# Patient Record
Sex: Female | Born: 1990
Health system: Southern US, Community
[De-identification: ages and names within clinical notes are randomized; demographics above are authoritative.]

## PROBLEM LIST (undated history)

## (undated) ENCOUNTER — Inpatient Hospital Stay (HOSPITAL_COMMUNITY): Payer: Self-pay

## (undated) DIAGNOSIS — Z8669 Personal history of other diseases of the nervous system and sense organs: Secondary | ICD-10-CM

## (undated) DIAGNOSIS — Z789 Other specified health status: Secondary | ICD-10-CM

## (undated) HISTORY — PX: GASTRECTOMY: SHX58

## (undated) HISTORY — DX: Personal history of other diseases of the nervous system and sense organs: Z86.69

---

## 2016-12-11 ENCOUNTER — Ambulatory Visit (INDEPENDENT_AMBULATORY_CARE_PROVIDER_SITE_OTHER): Admitting: Family Medicine

## 2016-12-11 ENCOUNTER — Encounter: Payer: Self-pay | Admitting: Family Medicine

## 2016-12-11 VITALS — BP 100/62 | HR 57 | Temp 98.4°F | Ht 64.0 in | Wt 154.8 lb

## 2016-12-11 DIAGNOSIS — Z9884 Bariatric surgery status: Secondary | ICD-10-CM | POA: Insufficient documentation

## 2016-12-11 DIAGNOSIS — Z01419 Encounter for gynecological examination (general) (routine) without abnormal findings: Secondary | ICD-10-CM

## 2016-12-11 DIAGNOSIS — Z0289 Encounter for other administrative examinations: Secondary | ICD-10-CM | POA: Insufficient documentation

## 2016-12-11 NOTE — Patient Instructions (Addendum)
Nice to meet you. Good luck with your fitness test. Please continue diet and exercise. We'll get you set up with a gynecologist for Pap smear and get you set up with the bariatric surgeon.

## 2016-12-11 NOTE — Assessment & Plan Note (Signed)
Patient with history of vertical sleeve gastrectomy. Has lost over 100 pounds with this. Remains active by exercising and watching her diet. We'll refer her to a bariatric surgeon in [place] for continued follow-up on this.

## 2016-12-11 NOTE — Assessment & Plan Note (Signed)
Reports she will be due for a Pap smear later this year. Referral to gynecology placed.

## 2016-12-11 NOTE — Progress Notes (Signed)
Marikay AlarEric Ludell Zacarias, MD Phone: [phone]  Katelyn Cox is a 26 y.o. female who presents today for new patient visit.  Patient recently moved here from [place]. 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 She needs a form filled out for this stating that she is physically able to complete the test. She does exercise 5 times a week by lifting weights and running. She has no difficulty with this. She does eat a healthy diet with fruits and vegetables and proteins. She did have a vertical sleeve gastrectomy in May 2017. She was followed with [facility] bariatric [facility] [place] for this and had routine lab work ordered on this.  She reports she is due for a Pap smear this year. Had one in 2015 and reports it was normal. No prior abnormal Pap smears. Has a period once a month lasting for 5 days. She needs a referral to a gynecologist.  She reports no other medical history.  Active Ambulatory Problems    Diagnosis Date Noted  . History of bariatric surgery 12/11/2016  . Visit for gynecologic examination 12/11/2016   Resolved Ambulatory Problems    Diagnosis Date Noted  . No Resolved Ambulatory Problems   No Additional Past Medical History    Family History  Problem Relation Age of Onset  . Leukemia Mother   . Throat cancer Maternal Grandmother     Social History   Social History  . Marital status: Single    Spouse name: N/A  . Number of children: N/A  . Years of education: N/A   Occupational History  . Not on file.   Social History Main Topics  . Smoking status: Never Smoker  . Smokeless tobacco: Never Used  . Alcohol use 0.6 oz/week    1 Glasses of wine per week  . Drug use: No  . Sexual activity: Not on file   Other Topics Concern  . Not on file   Social History Narrative  . No narrative on file    ROS  General:  Negative for nexplained weight loss, fever Skin: Negative for new or changing  mole, sore that won't heal HEENT: Negative for trouble hearing, trouble seeing, ringing in ears, mouth sores, hoarseness, change in voice, dysphagia. CV:  Negative for chest pain, dyspnea, edema, palpitations Resp: Negative for cough, dyspnea, hemoptysis GI: Negative for nausea, vomiting, diarrhea, constipation, abdominal pain, melena, hematochezia. GU: Negative for dysuria, incontinence, urinary hesitance, hematuria, vaginal or penile discharge, polyuria, sexual difficulty, lumps in testicle or breasts MSK: Negative for muscle cramps or aches, joint pain or swelling Neuro: Negative for headaches, weakness, numbness, dizziness, passing out/fainting Psych: Negative for depression, anxiety, memory problems  Objective  Physical Exam Vitals:   12/11/16 1045  BP: 100/62  Pulse: (!) 57  Temp: 98.4 F (36.9 C)    BP Readings from Last 3 Encounters:  12/11/16 100/62   Wt Readings from Last 3 Encounters:  12/11/16 154 lb 12.8 oz (70.2 kg)    Physical Exam  Constitutional: She is well-developed, well-nourished, and in no distress.  HENT:  Head: Normocephalic and atraumatic.  Mouth/Throat: Oropharynx is clear and moist. No oropharyngeal exudate.  Eyes: Conjunctivae are normal. Pupils are equal, round, and reactive to light.  Neck: Neck supple.  Cardiovascular: Normal rate, regular rhythm and normal heart sounds.   Pulmonary/Chest: Effort normal and breath sounds normal.  Abdominal: Soft. Bowel sounds are normal.  Musculoskeletal: She  exhibits no edema.  Lymphadenopathy:    She has no cervical adenopathy.  Neurological: She is alert. Gait normal.  Skin: Skin is warm and dry.  Psychiatric: Mood and affect normal.     Assessment/Plan:   History of bariatric surgery Patient with history of vertical sleeve gastrectomy. Has lost over 100 pounds with this. Remains active by exercising and watching her diet. We'll refer her to a bariatric surgeon in [place] for continued follow-up  on this.  Visit for gynecologic examination Reports she will be due for a Pap smear later this year. Referral to gynecology placed.  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 She has no issues exercising.  We will request records from her prior physicians in [place].  Orders Placed This Encounter  Procedures  . Ambulatory referral to Gynecology    Referral Priority:   Routine    Referral Type:   Consultation    Referral Reason:   Specialty Services Required    Requested Specialty:   Gynecology    Number of Visits Requested:   1  . Ambulatory referral to General Surgery    Referral Priority:   Routine    Referral Type:   Surgical    Referral Reason:   Specialty Services Required    Requested Specialty:   General Surgery    Number of Visits Requested:   1    No orders of the defined types were placed in this encounter.    Marikay Alar, MD [facility]

## 2016-12-11 NOTE — Progress Notes (Signed)
Pre visit review using our clinic review tool, if applicable. No additional management support is needed unless otherwise documented below in the visit note. 

## 2017-01-23 ENCOUNTER — Encounter: Payer: Self-pay | Admitting: Certified Nurse Midwife

## 2017-02-20 ENCOUNTER — Other Ambulatory Visit: Payer: Self-pay | Admitting: Occupational Medicine

## 2017-02-20 ENCOUNTER — Ambulatory Visit
Admission: RE | Admit: 2017-02-20 | Discharge: 2017-02-20 | Disposition: A | Discharge status: Home / Self Care | Source: Ambulatory Visit | Attending: Occupational Medicine | Admitting: Occupational Medicine

## 2017-02-20 DIAGNOSIS — Z021 Encounter for pre-employment examination: Secondary | ICD-10-CM

## 2017-10-28 ENCOUNTER — Other Ambulatory Visit: Payer: Self-pay

## 2017-10-28 ENCOUNTER — Encounter (HOSPITAL_COMMUNITY): Payer: Self-pay | Admitting: Emergency Medicine

## 2017-10-28 ENCOUNTER — Emergency Department (HOSPITAL_COMMUNITY): Payer: 59

## 2017-10-28 ENCOUNTER — Emergency Department (HOSPITAL_COMMUNITY)
Admission: EM | Admit: 2017-10-28 | Discharge: 2017-10-28 | Disposition: A | Discharge status: Home / Self Care | Payer: 59 | Attending: Emergency Medicine | Admitting: Emergency Medicine

## 2017-10-28 DIAGNOSIS — S199XXA Unspecified injury of neck, initial encounter: Secondary | ICD-10-CM | POA: Diagnosis not present

## 2017-10-28 DIAGNOSIS — S060X1A Concussion with loss of consciousness of 30 minutes or less, initial encounter: Secondary | ICD-10-CM | POA: Insufficient documentation

## 2017-10-28 DIAGNOSIS — Y9301 Activity, walking, marching and hiking: Secondary | ICD-10-CM | POA: Diagnosis not present

## 2017-10-28 DIAGNOSIS — W19XXXA Unspecified fall, initial encounter: Secondary | ICD-10-CM

## 2017-10-28 DIAGNOSIS — W0110XA Fall on same level from slipping, tripping and stumbling with subsequent striking against unspecified object, initial encounter: Secondary | ICD-10-CM | POA: Diagnosis not present

## 2017-10-28 DIAGNOSIS — Y9289 Other specified places as the place of occurrence of the external cause: Secondary | ICD-10-CM | POA: Diagnosis not present

## 2017-10-28 DIAGNOSIS — R51 Headache: Secondary | ICD-10-CM | POA: Diagnosis not present

## 2017-10-28 DIAGNOSIS — Y998 Other external cause status: Secondary | ICD-10-CM | POA: Insufficient documentation

## 2017-10-28 DIAGNOSIS — S0990XA Unspecified injury of head, initial encounter: Secondary | ICD-10-CM | POA: Diagnosis not present

## 2017-10-28 DIAGNOSIS — M542 Cervicalgia: Secondary | ICD-10-CM | POA: Diagnosis not present

## 2017-10-28 MED ORDER — ONDANSETRON 4 MG PO TBDP: 4.0000 mg | ORAL_TABLET | Freq: Three times a day (TID) | ORAL | 0 refills | Status: DC | PRN

## 2017-10-28 MED ORDER — ONDANSETRON 4 MG PO TBDP
4.0000 mg | ORAL_TABLET | Freq: Once | ORAL | Status: AC
  Administered 2017-10-28: 4 mg via ORAL
  Filled 2017-10-28: qty 1

## 2017-10-28 MED ORDER — TRAMADOL HCL 50 MG PO TABS: 50.0000 mg | ORAL_TABLET | Freq: Four times a day (QID) | ORAL | 0 refills | Status: DC | PRN

## 2017-10-28 MED ORDER — TRAMADOL HCL 50 MG PO TABS
50.0000 mg | ORAL_TABLET | Freq: Once | ORAL | Status: AC
  Administered 2017-10-28: 50 mg via ORAL
  Filled 2017-10-28: qty 1

## 2017-10-28 NOTE — ED Triage Notes (Signed)
Patient slipped and fell backward on pavement, hitting back of head and neck on pavement, positive momentary LOC. Complaining of pain to posterior head and neck. Patient alert and oriented and in no apparent distress at this time.

## 2017-10-28 NOTE — ED Provider Notes (Signed)
[facility] Provider Note   CSN: [id number] Arrival date & time: 10/28/17  [id number]     History   Chief Complaint Chief Complaint  Patient presents with  . Fall    HPI Katelyn Cox is a 26 y.o. female.  HPI   26 year old female with history of bariatric surgery and pseudotumor, now resolved, who presents with fall.  The patient is currently in the police academy.  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  There was a positive loss of consciousness for at least several minutes.  No vomiting.  She has been nauseous since the event, but has been able to eat and drink and walk.  She denies any associated upper or lower extremity numbness or tingling.  She was well prior to the episode.  No history of concussions.  No blood thinner use.  No history of frequent falls or easy bruising or bleeding.  She, throbbing posterior headache right between her spine and skull.  Pain is worse with any movement or palpation.  History reviewed. No pertinent past medical history.  Patient Active Problem List   Diagnosis Date Noted  . History of bariatric surgery 12/11/2016  . Visit for gynecologic examination 12/11/2016    Past Surgical History:  Procedure Laterality Date  . GASTRECTOMY     vertical sleeve gastrectomy    OB History    No data available       Home Medications    Prior to Admission medications   Medication Sig Start Date End Date Taking? Authorizing Provider  ondansetron (ZOFRAN ODT) 4 MG disintegrating tablet Take 1 tablet (4 mg total) by mouth every 8 (eight) hours as needed for nausea or vomiting. 10/28/17   Shaune PollackIsaacs, De Lib[place], MD  traMADol (ULTRAM) 50 MG tablet Take 1 tablet (50 mg total) by mouth every 6 (six) hours as needed for moderate pain. 10/28/17   Shaune PollackIsaacs, Dmari Schubring, MD    Family History Family History  Problem Relation Age of Onset  . Leukemia Mother   . Throat cancer Maternal  Grandmother     Social History Social History   Tobacco Use  . Smoking status: Never Smoker  . Smokeless tobacco: Never Used  Substance Use Topics  . Alcohol use: Yes    Alcohol/week: 0.6 oz    Types: 1 Glasses of wine per week  . Drug use: No     Allergies   Patient has no known allergies.   Review of Systems Review of Systems  Constitutional: Negative for chills, fatigue and fever.  HENT: Negative for congestion and rhinorrhea.   Eyes: Negative for visual disturbance.  Respiratory: Negative for cough, shortness of breath and wheezing.   Cardiovascular: Negative for chest pain and leg swelling.  Gastrointestinal: Negative for abdominal pain, diarrhea, nausea and vomiting.  Genitourinary: Negative for dysuria and flank pain.  Musculoskeletal: Positive for neck pain. Negative for neck stiffness.  Skin: Negative for rash and wound.  Allergic/Immunologic: Negative for immunocompromised state.  Neurological: Positive for syncope and headaches. Negative for weakness.  All other systems reviewed and are negative.    Physical Exam Updated Vital Signs BP 129/89 (BP Location: Right Arm)   Pulse 77   Temp 98.6 F (37 C) (Oral)   Resp 15   LMP 10/24/2017 (Exact Date)   SpO2 99%   Physical Exam  Constitutional: She is oriented to person, place, and time. She appears well-developed and well-nourished. No distress.  HENT:  Head: Normocephalic and atraumatic.  No open wounds or lacerations.  Mild tenderness to palpation over occiput without appreciable hematoma.  No postauricular bleeding.  No hemotympanum.  No periocular ecchymoses.  Eyes: Conjunctivae are normal.  Neck: Neck supple.  Mild tenderness to palpation over upper cervical, midline spine.  No bruising or deformity.  No paraspinal tenderness.  Cardiovascular: Normal rate, regular rhythm and normal heart sounds. Exam reveals no friction rub.  No murmur heard. Pulmonary/Chest: Effort normal and breath sounds normal.  No respiratory distress. She has no wheezes. She has no rales.  Abdominal: She exhibits no distension.  Musculoskeletal: She exhibits no edema.  Neurological: She is alert and oriented to person, place, and time. She exhibits normal muscle tone.  Skin: Skin is warm. Capillary refill takes less than 2 seconds.  Psychiatric: She has a normal mood and affect.  Nursing note and vitals reviewed.    ED Treatments / Results  Labs (all labs ordered are listed, but only abnormal results are displayed) Labs Reviewed - No data to display  EKG  EKG Interpretation  Date/Time:  Tuesday October 28 2017 07:28:47 EST Ventricular Rate:  79 PR Interval:    QRS Duration: 89 QT Interval:  389 QTC Calculation: 446 R Axis:   91 Text Interpretation:  Sinus rhythm Borderline right axis deviation No old tracing to compare EKG WITHIN NORMAL LIMITS Confirmed by Shaune PollackIsaacs, Keyondre Hepburn [id number]) on 10/28/2017 7:39:59 AM       Radiology Ct Head Wo Contrast  Result Date: 10/28/2017 CLINICAL DATA:  Pain following fall EXAM: CT HEAD WITHOUT CONTRAST CT CERVICAL SPINE WITHOUT CONTRAST TECHNIQUE: Multidetector CT imaging of the head and cervical spine was performed following the standard protocol without intravenous contrast. Multiplanar CT image reconstructions of the cervical spine were also generated. COMPARISON:  None. FINDINGS: CT HEAD FINDINGS Brain: The ventricles are normal in size and configuration. There is no intracranial mass, hemorrhage, extra-axial fluid collection, or midline shift. Gray-white compartments are normal. There is no evident acute infarct. Vascular: No hyperdense vessel.  No vascular calcification evident. Skull: The bony calvarium appears intact. Sinuses/Orbits: Visualized paranasal sinuses are clear. Visualized orbits appear symmetric bilaterally. Other: Mastoid air cells are clear. CT CERVICAL SPINE FINDINGS Alignment: There is no spondylolisthesis. Skull base and vertebrae: Skull base and  craniocervical junction regions appear normal. There is no evident fracture. There are no blastic or lytic bone lesions. Soft tissues and spinal canal: Prevertebral soft tissues and predental space regions are normal. There are no paraspinous lesions. There is no cord or canal hematoma evident. Disc levels: Disc spaces appear normal. There is no appreciable nerve root edema or effacement. No disc extrusion or stenosis. Upper chest: Visualized upper lung zones are clear. Other: None IMPRESSION: CT head:  Study within normal limits. CT cervical spine: No fracture or spondylolisthesis. No appreciable arthropathy. Electronically Signed   By: Bretta BangWilliam  Woodruff III M.D.   On: 10/28/2017 08:22   Ct Cervical Spine Wo Contrast  Result Date: 10/28/2017 CLINICAL DATA:  Pain following fall EXAM: CT HEAD WITHOUT CONTRAST CT CERVICAL SPINE WITHOUT CONTRAST TECHNIQUE: Multidetector CT imaging of the head and cervical spine was performed following the standard protocol without intravenous contrast. Multiplanar CT image reconstructions of the cervical spine were also generated. COMPARISON:  None. FINDINGS: CT HEAD FINDINGS Brain: The ventricles are normal in size and configuration. There is no intracranial mass, hemorrhage, extra-axial fluid collection, or midline shift. Gray-white compartments are normal. There is no evident acute infarct. Vascular: No hyperdense vessel.  No vascular calcification evident. Skull: The bony calvarium appears intact. Sinuses/Orbits: Visualized paranasal sinuses are clear. Visualized orbits appear symmetric bilaterally. Other: Mastoid air cells are clear. CT CERVICAL SPINE FINDINGS Alignment: There is no spondylolisthesis. Skull base and vertebrae: Skull base and craniocervical junction regions appear normal. There is no evident fracture. There are no blastic or lytic bone lesions. Soft tissues and spinal canal: Prevertebral soft tissues and predental space regions are normal. There are no  paraspinous lesions. There is no cord or canal hematoma evident. Disc levels: Disc spaces appear normal. There is no appreciable nerve root edema or effacement. No disc extrusion or stenosis. Upper chest: Visualized upper lung zones are clear. Other: None IMPRESSION: CT head:  Study within normal limits. CT cervical spine: No fracture or spondylolisthesis. No appreciable arthropathy. Electronically Signed   By: Bretta Bang III M.D.   On: 10/28/2017 08:22    Procedures Procedures (including critical care time)  Medications Ordered in ED Medications  ondansetron (ZOFRAN-ODT) disintegrating tablet 4 mg (4 mg Oral Given 10/28/17 0739)  traMADol (ULTRAM) tablet 50 mg (50 mg Oral Given 10/28/17 0739)     Initial Impression / Assessment and Plan / ED Course  I have reviewed the triage vital signs and the nursing notes.  Pertinent labs & imaging results that were available during my care of the patient were reviewed by me and considered in my medical decision making (see chart for details).     26 year old female with no significant past medical history, not on blood thinners, here with fall and loss of consciousness.  The fall was mechanical in nature.  EKG is unremarkable with normal intervals.  Will obtain CT scans.  I suspect patient at least has a minor concussion.  No clinical signs of basilar skull fracture.  No upper extremity or lower extremity numbness, tingling, weakness, or signs to suggest occult spinal cord injury/central cord syndrome.  CT imaging is negative.  Patient feels better after symptomatic treatment.  Will treat his mild concussion with strict return to work/play instructions, and discharged home.  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  Final Clinical Impressions(s) / ED Diagnoses   Final diagnoses:  Fall, initial encounter  Concussion with loss of consciousness of 30 minutes or less, initial encounter    ED Discharge Orders        Ordered     ondansetron (ZOFRAN ODT) 4 MG disintegrating tablet  Every 8 hours PRN     10/28/17 0848    traMADol (ULTRAM) 50 MG tablet  Every 6 hours PRN     10/28/17 0848       Shaune Pollack, MD 10/28/17 [phone]

## 2017-10-28 NOTE — Discharge Instructions (Signed)

## 2017-11-18 NOTE — L&D Delivery Note (Signed)
Delivery Note At 2:32 PM a viable female was delivered via  (Presentation:DOA ;  ).  APGAR: per nursing notes , ; weight pending .   Placenta status: , spontaneous, intact.  Cord: 3VC, loose nuchal, rotated and delivered through with the following complications: .none  Cord pH: n/a  Anesthesia:  epidural Episiotomy:  none Lacerations:  none Suture Repair: nA Est. Blood Loss (mL):  Per nursing notes  Mom to postpartum.  Baby to Couplet care / Skin to Skin.  Katelyn ColonelKelly A Melanye Cox 11/17/2018, 2:46 PM

## 2017-11-26 IMAGING — CR DG CHEST 1V
1 series · 1 of 1 positions shown · non-contrast
Comparison: No prior.

CLINICAL DATA: Pre-employment exam.

EXAM:
CHEST 1 VIEW

[w chest pa]
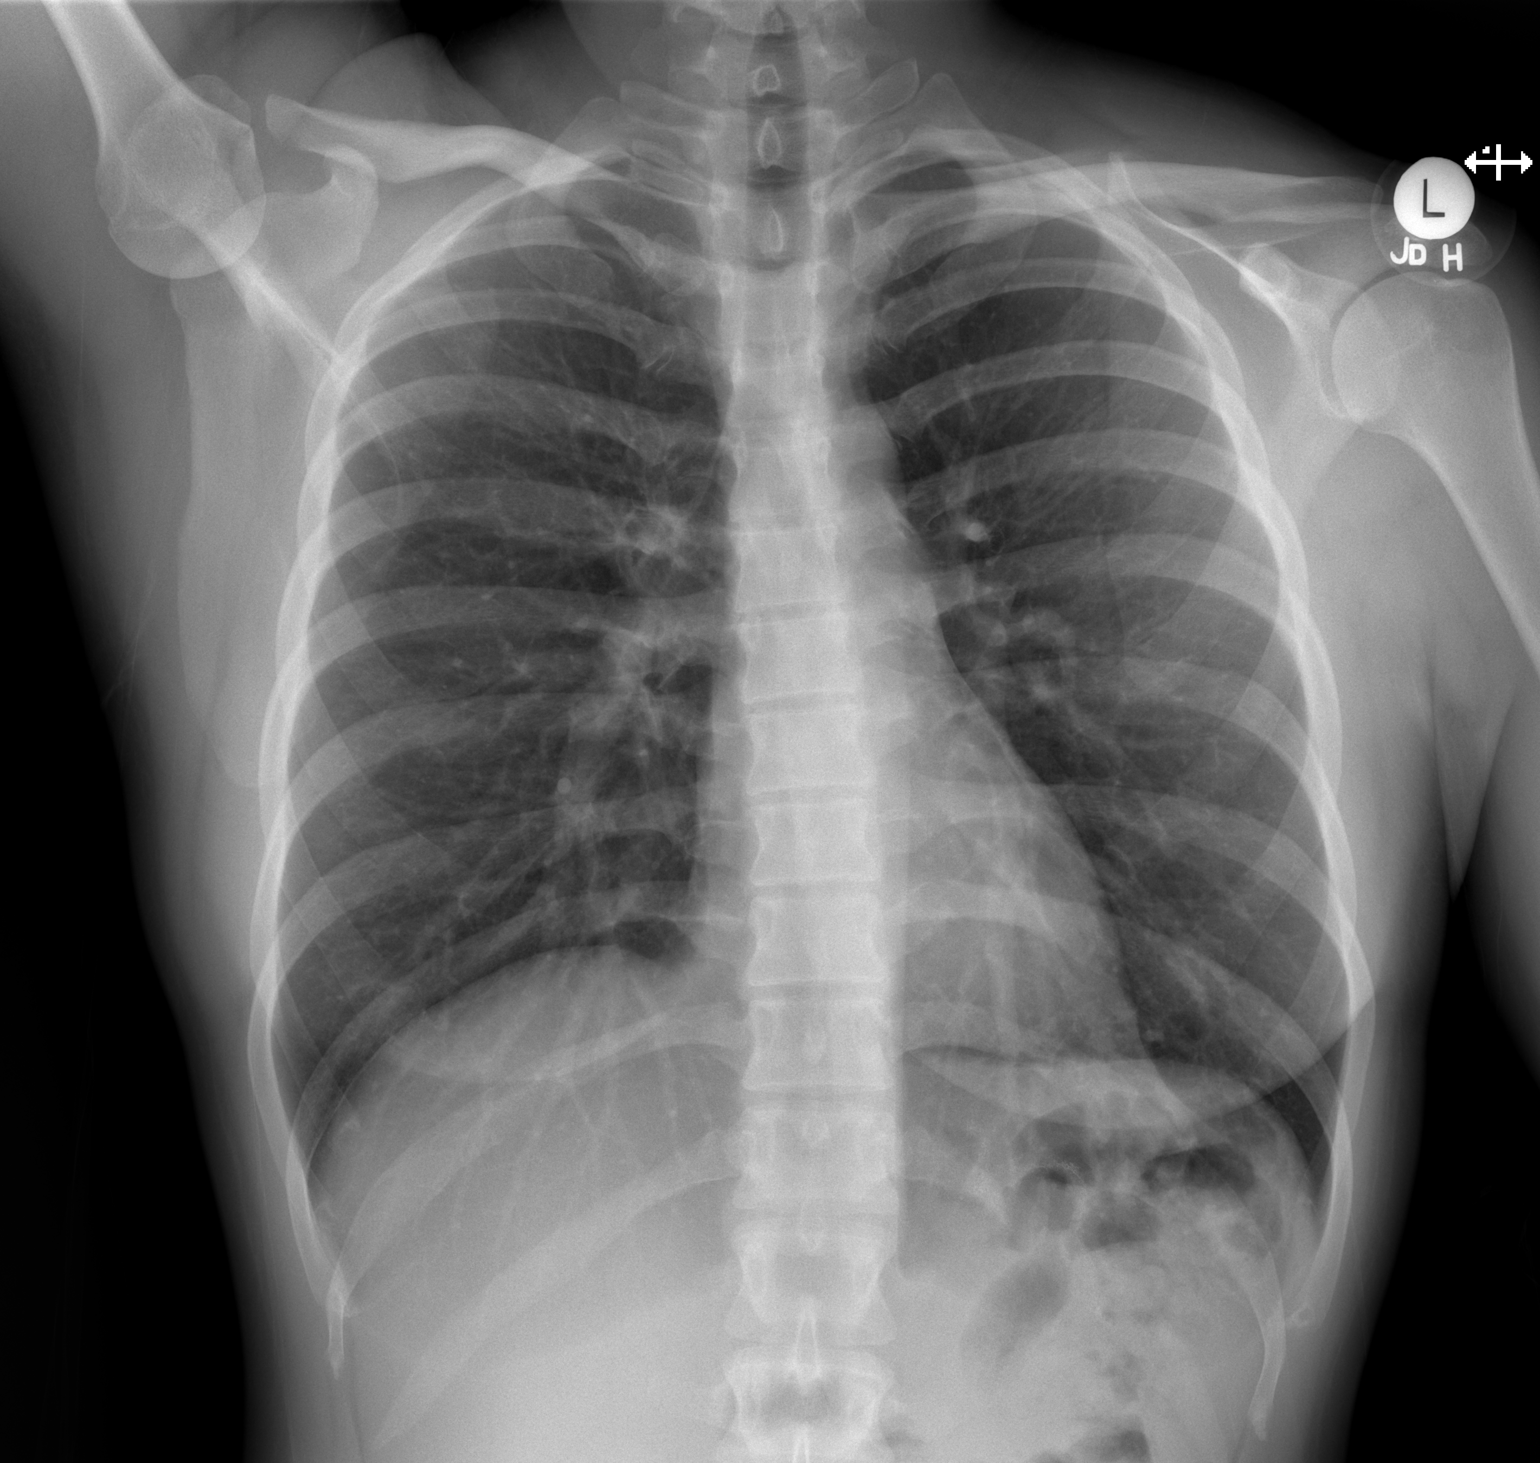

[1 of 1 positions shown; findings below may reference images not displayed]

FINDINGS: Mediastinum and hilar structures normal. Lungs are clear. No pleural
effusion or pneumothorax. Mild scoliosis thoracic spine.
IMPRESSION: No acute abnormality.

## 2018-02-02 DIAGNOSIS — M5412 Radiculopathy, cervical region: Secondary | ICD-10-CM | POA: Diagnosis not present

## 2018-02-02 DIAGNOSIS — M9902 Segmental and somatic dysfunction of thoracic region: Secondary | ICD-10-CM | POA: Diagnosis not present

## 2018-02-02 DIAGNOSIS — M9901 Segmental and somatic dysfunction of cervical region: Secondary | ICD-10-CM | POA: Diagnosis not present

## 2018-02-03 DIAGNOSIS — M9902 Segmental and somatic dysfunction of thoracic region: Secondary | ICD-10-CM | POA: Diagnosis not present

## 2018-02-03 DIAGNOSIS — M9901 Segmental and somatic dysfunction of cervical region: Secondary | ICD-10-CM | POA: Diagnosis not present

## 2018-02-03 DIAGNOSIS — M5412 Radiculopathy, cervical region: Secondary | ICD-10-CM | POA: Diagnosis not present

## 2018-02-04 DIAGNOSIS — M5412 Radiculopathy, cervical region: Secondary | ICD-10-CM | POA: Diagnosis not present

## 2018-02-04 DIAGNOSIS — M9902 Segmental and somatic dysfunction of thoracic region: Secondary | ICD-10-CM | POA: Diagnosis not present

## 2018-02-04 DIAGNOSIS — M9901 Segmental and somatic dysfunction of cervical region: Secondary | ICD-10-CM | POA: Diagnosis not present

## 2018-03-10 DIAGNOSIS — N926 Irregular menstruation, unspecified: Secondary | ICD-10-CM | POA: Diagnosis not present

## 2018-03-16 DIAGNOSIS — Z3201 Encounter for pregnancy test, result positive: Secondary | ICD-10-CM | POA: Diagnosis not present

## 2018-04-01 DIAGNOSIS — Z3201 Encounter for pregnancy test, result positive: Secondary | ICD-10-CM | POA: Diagnosis not present

## 2018-04-22 DIAGNOSIS — Z3481 Encounter for supervision of other normal pregnancy, first trimester: Secondary | ICD-10-CM | POA: Diagnosis not present

## 2018-04-22 LAB — OB RESULTS CONSOLE ABO/RH: RH Type: POSITIVE

## 2018-04-22 LAB — OB RESULTS CONSOLE ANTIBODY SCREEN: ANTIBODY SCREEN: NEGATIVE

## 2018-04-22 LAB — OB RESULTS CONSOLE GC/CHLAMYDIA
Chlamydia: NEGATIVE
GC PROBE AMP, GENITAL: NEGATIVE

## 2018-04-22 LAB — OB RESULTS CONSOLE HIV ANTIBODY (ROUTINE TESTING): HIV: NONREACTIVE

## 2018-04-22 LAB — OB RESULTS CONSOLE RUBELLA ANTIBODY, IGM: Rubella: IMMUNE

## 2018-04-22 LAB — OB RESULTS CONSOLE RPR: RPR: NONREACTIVE

## 2018-04-22 LAB — OB RESULTS CONSOLE HEPATITIS B SURFACE ANTIGEN: Hepatitis B Surface Ag: NEGATIVE

## 2018-04-29 DIAGNOSIS — Z118 Encounter for screening for other infectious and parasitic diseases: Secondary | ICD-10-CM | POA: Diagnosis not present

## 2018-04-29 DIAGNOSIS — Z3481 Encounter for supervision of other normal pregnancy, first trimester: Secondary | ICD-10-CM | POA: Diagnosis not present

## 2018-07-03 ENCOUNTER — Other Ambulatory Visit: Payer: Self-pay | Admitting: Obstetrics

## 2018-07-14 ENCOUNTER — Ambulatory Visit (HOSPITAL_COMMUNITY)
Admission: RE | Admit: 2018-07-14 | Discharge: 2018-07-14 | Disposition: A | Discharge status: Home / Self Care | Payer: 59 | Source: Ambulatory Visit | Attending: Obstetrics | Admitting: Obstetrics

## 2018-07-14 DIAGNOSIS — D509 Iron deficiency anemia, unspecified: Secondary | ICD-10-CM | POA: Diagnosis present

## 2018-07-14 MED ORDER — SODIUM CHLORIDE 0.9 % IV SOLN
INTRAVENOUS | Status: DC | PRN
  Administered 2018-07-14: 250 mL via INTRAVENOUS

## 2018-07-14 MED ORDER — SODIUM CHLORIDE 0.9 % IV SOLN
510.0000 mg | Freq: Once | INTRAVENOUS | Status: AC
  Administered 2018-07-14: 510 mg via INTRAVENOUS
  Filled 2018-07-14: qty 17

## 2018-07-14 NOTE — Progress Notes (Signed)
PATIENT CARE CENTER NOTE  Diagnosis: Iron Deficiency Anemia    Provider: Dr. Ernestina PennaFogleman    Procedure: IV Feraheme    Note: Patient received IV Feraheme infusion. Patient tolerated infusion well with no adverse reaction. Vital signs remained stable. Discharge instructions given. Patient alert, oriented and ambulatory at discharge.

## 2018-07-14 NOTE — Discharge Instructions (Signed)

## 2018-07-21 ENCOUNTER — Ambulatory Visit (HOSPITAL_COMMUNITY)
Admission: RE | Admit: 2018-07-21 | Discharge: 2018-07-21 | Disposition: A | Discharge status: Home / Self Care | Payer: 59 | Source: Ambulatory Visit | Attending: Obstetrics | Admitting: Obstetrics

## 2018-07-21 DIAGNOSIS — D509 Iron deficiency anemia, unspecified: Secondary | ICD-10-CM | POA: Insufficient documentation

## 2018-07-21 MED ORDER — SODIUM CHLORIDE 0.9 % IV SOLN
INTRAVENOUS | Status: DC
  Administered 2018-07-21: 11:00:00 via INTRAVENOUS

## 2018-07-21 MED ORDER — SODIUM CHLORIDE 0.9 % IV SOLN
510.0000 mg | Freq: Once | INTRAVENOUS | Status: AC
  Administered 2018-07-21: 510 mg via INTRAVENOUS
  Filled 2018-07-21: qty 17

## 2018-07-21 NOTE — Discharge Instructions (Signed)

## 2018-07-21 NOTE — Progress Notes (Addendum)
PATIENT CARE CENTER NOTE  Diagnosis: Iron Deficiency Anemia    Provider: Dr. Ernestina Penna    Procedure: IV Feraheme    Note: Patient received IV Feraheme infusion. Patient tolerated infusion well with no adverse reaction. Vital signs remained stable. Discharge instructions given. Patient alert, oriented and ambulatory at discharge.

## 2018-08-26 DIAGNOSIS — Z23 Encounter for immunization: Secondary | ICD-10-CM | POA: Diagnosis not present

## 2018-08-27 DIAGNOSIS — O3663X0 Maternal care for excessive fetal growth, third trimester, not applicable or unspecified: Secondary | ICD-10-CM | POA: Diagnosis not present

## 2018-08-27 DIAGNOSIS — Z3A27 27 weeks gestation of pregnancy: Secondary | ICD-10-CM | POA: Diagnosis not present

## 2018-08-27 DIAGNOSIS — O99012 Anemia complicating pregnancy, second trimester: Secondary | ICD-10-CM | POA: Diagnosis not present

## 2018-09-08 ENCOUNTER — Other Ambulatory Visit: Payer: Self-pay

## 2018-09-08 ENCOUNTER — Observation Stay
Admission: EM | Admit: 2018-09-08 | Discharge: 2018-09-09 | Disposition: A | Discharge status: Home / Self Care | Payer: 59 | Attending: Obstetrics and Gynecology | Admitting: Obstetrics and Gynecology

## 2018-09-08 DIAGNOSIS — O26893 Other specified pregnancy related conditions, third trimester: Principal | ICD-10-CM | POA: Insufficient documentation

## 2018-09-08 DIAGNOSIS — Z3A30 30 weeks gestation of pregnancy: Secondary | ICD-10-CM

## 2018-09-08 NOTE — OB Triage Note (Signed)
Pt arrived to unit with c/o lower abdominal contractions q 2 min, lasting 40-60 seconds x 2 hours. Denies LOF, bleeding or other concerns. +FM. Continue to assess.

## 2018-09-09 DIAGNOSIS — Z3A29 29 weeks gestation of pregnancy: Secondary | ICD-10-CM | POA: Diagnosis not present

## 2018-09-09 DIAGNOSIS — Z043 Encounter for examination and observation following other accident: Secondary | ICD-10-CM | POA: Diagnosis not present

## 2018-09-09 DIAGNOSIS — O26893 Other specified pregnancy related conditions, third trimester: Secondary | ICD-10-CM | POA: Diagnosis not present

## 2018-09-09 DIAGNOSIS — Z3A30 30 weeks gestation of pregnancy: Secondary | ICD-10-CM | POA: Diagnosis not present

## 2018-09-09 DIAGNOSIS — O479 False labor, unspecified: Secondary | ICD-10-CM | POA: Diagnosis not present

## 2018-09-09 NOTE — Discharge Summary (Signed)
Physician Discharge Summary   Patient ID: Katelyn Cox [id number] 27 y.o. 1991/01/22  Admit date: 09/08/2018  Discharge date and time: No discharge date for patient encounter.   Admitting Physician: Natale Milch, MD   Discharge Physician: Adelene Idler MD  Admission Diagnoses: [redacted] week gestation, abdominal/ back pain Discharge Diagnoses: same as above  Admission Condition: good  Discharged Condition: good  Indication for Admission: Triage visit  Hospital Course: She was admitted and evaluated in triage. She reports that this evening since about 7:30 pm she began experiencing cramping abdominal pain. She follows with an OB/GYN in [place], but lives closer to here and wished to be evaluated. She had a similar episode earlier in the pregnancy and it passed on its own. She requested discharge through nursing earlier this evening.  She denies leakage of fluid. Denies vaginal bleeding. She is feeling fetal movement. She has had an uncomplicated regnancy. She denies medical problems. She denies current medications. She has had a gastric sleeve surgery. She reports that her last intercourse was on Monday.  SVE showed her to be 1 cm/ long/ -3 station  Consults: None  Significant Diagnostic Studies: None  Treatments: None  Discharge Exam: Temp 98 F (36.7 C) (Oral)   Resp 18   Ht 5\' 4"  (1.626 m)   Wt 85.7 kg   LMP 10/24/2017 (Exact Date)   BMI 32.44 kg/m   General Appearance:    Alert, cooperative, no distress, appears stated age  Head:    Normocephalic, without obvious abnormality, atraumatic  Eyes:    PERRL, conjunctiva/corneas clear, EOM's intact, fundi    benign, both eyes  Ears:    Normal TM's and external ear canals, both ears  Nose:   Nares normal, septum midline, mucosa normal, no drainage    or sinus tenderness  Throat:   Lips, mucosa, and tongue normal; teeth and gums normal  Neck:   Supple, symmetrical, trachea midline, no adenopathy;    thyroid:  no  enlargement/tenderness/nodules; no carotid   bruit or JVD  Back:     Symmetric, no curvature, ROM normal, no CVA tenderness  Lungs:     Clear to auscultation bilaterally, respirations unlabored  Chest Wall:    No tenderness or deformity   Heart:    Regular rate and rhythm, S1 and S2 normal, no murmur, rub   or gallop  Breast Exam:    No tenderness, masses, or nipple abnormality  Abdomen:     Soft, non-tender, bowel sounds active all four quadrants,    no masses, no organomegaly  Genitalia:    Normal female without lesion, discharge or tenderness. SVE 1/long/-3  Rectal:    Normal tone, normal prostate, no masses or tenderness;   guaiac negative stool  Extremities:   Extremities normal, atraumatic, no cyanosis or edema  Pulses:   2+ and symmetric all extremities  Skin:   Skin color, texture, turgor normal, no rashes or lesions  Lymph nodes:   Cervical, supraclavicular, and axillary nodes normal  Neurologic:   CNII-XII intact, normal strength, sensation and reflexes    throughout    Disposition:   Patient Instructions:   Activity: activity as tolerated Diet: regular diet Wound Care: none needed  Follow-up with her OBGYN in 1 days if her cramping returns or worsens.   Signed: Natale Milch 09/09/2018 1:45 AM

## 2018-09-09 NOTE — Discharge Instructions (Signed)
Preterm Labor and Birth Information Pregnancy normally lasts 39-41 weeks. Preterm labor is when labor starts early. It starts before you have been pregnant for 37 whole weeks. What are the risk factors for preterm labor? Preterm labor is more likely to occur in women who:  Have an infection while pregnant.  Have a cervix that is short.  Have gone into preterm labor before.  Have had surgery on their cervix.  Are younger than age 28.  Are older than age 67.  Are African American.  Are pregnant with two or more babies.  Take street drugs while pregnant.  Smoke while pregnant.  Do not gain enough weight while pregnant.  Got pregnant right after another pregnancy.  What are the symptoms of preterm labor? Symptoms of preterm labor include:  Cramps. The cramps may feel like the cramps some women get during their period. The cramps may happen with watery poop (diarrhea).  Pain in the belly (abdomen).  Pain in the lower back.  Regular contractions or tightening. It may feel like your belly is getting tighter.  Pressure in the lower belly that seems to get stronger.  More fluid (discharge) leaking from the vagina. The fluid may be watery or bloody.  Water breaking.  Why is it important to notice signs of preterm labor? Babies who are born early may not be fully developed. They have a higher chance for:  Long-term heart problems.  Long-term lung problems.  Trouble controlling body systems, like breathing.  Bleeding in the brain.  A condition called cerebral palsy.  Learning difficulties.  Death.  These risks are highest for babies who are born before 34 weeks of pregnancy. How is preterm labor treated? Treatment depends on:  How long you were pregnant.  Your condition.  The health of your baby.  Treatment may involve:  Having a stitch (suture) placed in your cervix. When you give birth, your cervix opens so the baby can come out. The stitch keeps the  cervix from opening too soon.  Staying at the hospital.  Taking or getting medicines, such as: ? Hormone medicines. ? Medicines to stop contractions. ? Medicines to help the babys lungs develop. ? Medicines to prevent your baby from having cerebral palsy.  What should I do if I am in preterm labor? If you think you are going into labor too soon, call your doctor right away. How can I prevent preterm labor?  Do not use any tobacco products. ? Examples of these are cigarettes, chewing tobacco, and e-cigarettes. ? If you need help quitting, ask your doctor.  Do not use street drugs.  Do not use any medicines unless you ask your doctor if they are safe for you.  Talk with your doctor before taking any herbal supplements.  Make sure you gain enough weight.  Watch for infection. If you think you might have an infection, get it checked right away.  If you have gone into preterm labor before, tell your doctor. This information is not intended to replace advice given to you by your health care provider. Make sure you discuss any questions you have with your health care provider. Document Released: 01/31/2009 Document Revised: 04/16/2016 Document Reviewed: 03/27/2016 Elsevier Interactive Patient Education  2018 [employer]. LABOR: When contractions begin, you should start to time them from the beginning of one contraction to the beginning of the next.  When contractions are 5-10 minutes apart or less and have been regular for at least an hour, you should call your  health care provider.  Notify your doctor if any of the following occur: 1. Bleeding from the vagina 7. Sudden, constant, or occasional abdominal pain  2. Pain or burning when urinating 8. Sudden gushing of fluid from the vagina (with or without continued leaking)  3. Chills or fever 9. Fainting spells, "black outs" or loss of consciousness  4. Increase in vaginal discharge 10. Severe or continued nausea or vomiting  5.  Pelvic pressure (sudden increase) 11. Blurring of vision or spots before the eyes  6. Baby moving less than usual 12. Leaking of fluid    FETAL KICK COUNT: Lie on your left side for one hour after a meal, and count the number of times your baby kicks. If it is less than 5 times, get up, move around and drink some juice. Repeat the test 30 minutes later. If it is still less than 5 kicks in an hour, notify your doctor.

## 2018-09-16 DIAGNOSIS — Z3A30 30 weeks gestation of pregnancy: Secondary | ICD-10-CM | POA: Diagnosis not present

## 2018-09-16 DIAGNOSIS — Z23 Encounter for immunization: Secondary | ICD-10-CM | POA: Diagnosis not present

## 2018-09-16 DIAGNOSIS — O3663X0 Maternal care for excessive fetal growth, third trimester, not applicable or unspecified: Secondary | ICD-10-CM | POA: Diagnosis not present

## 2018-10-08 ENCOUNTER — Inpatient Hospital Stay (HOSPITAL_COMMUNITY)
Admission: AD | Admit: 2018-10-08 | Discharge: 2018-10-08 | Disposition: A | Discharge status: Home / Self Care | Payer: 59 | Source: Ambulatory Visit | Attending: Obstetrics & Gynecology | Admitting: Obstetrics & Gynecology

## 2018-10-08 ENCOUNTER — Other Ambulatory Visit: Payer: Self-pay

## 2018-10-08 ENCOUNTER — Encounter (HOSPITAL_COMMUNITY): Payer: Self-pay

## 2018-10-08 DIAGNOSIS — Z3A34 34 weeks gestation of pregnancy: Secondary | ICD-10-CM | POA: Diagnosis not present

## 2018-10-08 DIAGNOSIS — E860 Dehydration: Secondary | ICD-10-CM | POA: Diagnosis not present

## 2018-10-08 DIAGNOSIS — R42 Dizziness and giddiness: Secondary | ICD-10-CM | POA: Diagnosis not present

## 2018-10-08 DIAGNOSIS — O1203 Gestational edema, third trimester: Secondary | ICD-10-CM | POA: Insufficient documentation

## 2018-10-08 DIAGNOSIS — N859 Noninflammatory disorder of uterus, unspecified: Secondary | ICD-10-CM

## 2018-10-08 DIAGNOSIS — O26893 Other specified pregnancy related conditions, third trimester: Secondary | ICD-10-CM | POA: Insufficient documentation

## 2018-10-08 DIAGNOSIS — N858 Other specified noninflammatory disorders of uterus: Secondary | ICD-10-CM

## 2018-10-08 DIAGNOSIS — R600 Localized edema: Secondary | ICD-10-CM

## 2018-10-08 HISTORY — DX: Other specified health status: Z78.9

## 2018-10-08 LAB — CBC
HEMATOCRIT: 34.6 % — AB (ref 36.0–46.0)
Hemoglobin: 11.4 g/dL — ABNORMAL LOW (ref 12.0–15.0)
MCH: 31.1 pg (ref 26.0–34.0)
MCHC: 32.9 g/dL (ref 30.0–36.0)
MCV: 94.5 fL (ref 80.0–100.0)
NRBC: 0 % (ref 0.0–0.2)
Platelets: 230 10*3/uL (ref 150–400)
RBC: 3.66 MIL/uL — ABNORMAL LOW (ref 3.87–5.11)
RDW: 13.2 % (ref 11.5–15.5)
WBC: 7 10*3/uL (ref 4.0–10.5)

## 2018-10-08 LAB — COMPREHENSIVE METABOLIC PANEL
ALT: 11 U/L (ref 0–44)
AST: 14 U/L — AB (ref 15–41)
Albumin: 3.2 g/dL — ABNORMAL LOW (ref 3.5–5.0)
Alkaline Phosphatase: 50 U/L (ref 38–126)
Anion gap: 9 (ref 5–15)
BILIRUBIN TOTAL: 0.9 mg/dL (ref 0.3–1.2)
BUN: 12 mg/dL (ref 6–20)
CO2: 22 mmol/L (ref 22–32)
Calcium: 8.8 mg/dL — ABNORMAL LOW (ref 8.9–10.3)
Chloride: 107 mmol/L (ref 98–111)
Creatinine, Ser: 0.61 mg/dL (ref 0.44–1.00)
GFR calc Af Amer: >60 mL/min (ref >60)
Glucose, Bld: 90 mg/dL (ref 70–99)
POTASSIUM: 3.8 mmol/L (ref 3.5–5.1)
Sodium: 138 mmol/L (ref 135–145)
TOTAL PROTEIN: 6.3 g/dL — AB (ref 6.5–8.1)

## 2018-10-08 LAB — URINALYSIS, ROUTINE W REFLEX MICROSCOPIC
BILIRUBIN URINE: NEGATIVE
Glucose, UA: NEGATIVE mg/dL
HGB URINE DIPSTICK: NEGATIVE
Ketones, ur: 5 mg/dL — AB
NITRITE: NEGATIVE
PH: 5 (ref 5.0–8.0)
Protein, ur: NEGATIVE mg/dL
Specific Gravity, Urine: 1.032 — ABNORMAL HIGH (ref 1.005–1.030)

## 2018-10-08 NOTE — Discharge Instructions (Signed)
Third Trimester of Pregnancy °The third trimester is from week 29 through week 42, months 7 through 9. This trimester is when your unborn baby (fetus) is growing very fast. At the end of the ninth month, the unborn baby is about 20 inches in length. It weighs about 6-10 pounds. °Follow these instructions at home: °· Avoid all smoking, herbs, and alcohol. Avoid drugs not approved by your doctor. °· Do not use any tobacco products, including cigarettes, chewing tobacco, and electronic cigarettes. If you need help quitting, ask your doctor. You may get counseling or other support to help you quit. °· Only take medicine as told by your doctor. Some medicines are safe and some are not during pregnancy. °· Exercise only as told by your doctor. Stop exercising if you start having cramps. °· Eat regular, healthy meals. °· Wear a good support bra if your breasts are tender. °· Do not use hot tubs, steam rooms, or saunas. °· Wear your seat belt when driving. °· Avoid raw meat, uncooked cheese, and liter boxes and soil used by cats. °· Take your prenatal vitamins. °· Take 1500-2000 milligrams of calcium daily starting at the 20th week of pregnancy until you deliver your baby. °· Try taking medicine that helps you poop (stool softener) as needed, and if your doctor approves. Eat more fiber by eating fresh fruit, vegetables, and whole grains. Drink enough fluids to keep your pee (urine) clear or pale yellow. °· Take warm water baths (sitz baths) to soothe pain or discomfort caused by hemorrhoids. Use hemorrhoid cream if your doctor approves. °· If you have puffy, bulging veins (varicose veins), wear support hose. Raise (elevate) your feet for 15 minutes, 3-4 times a day. Limit salt in your diet. °· Avoid heavy lifting, wear low heels, and sit up straight. °· Rest with your legs raised if you have leg cramps or low back pain. °· Visit your dentist if you have not gone during your pregnancy. Use a soft toothbrush to brush your  teeth. Be gentle when you floss. °· You can have sex (intercourse) unless your doctor tells you not to. °· Do not travel far distances unless you must. Only do so with your doctor's approval. °· Take prenatal classes. °· Practice driving to the hospital. °· Pack your hospital bag. °· Prepare the baby's room. °· Go to your doctor visits. °Get help if: °· You are not sure if you are in labor or if your water has broken. °· You are dizzy. °· You have mild cramps or pressure in your lower belly (abdominal). °· You have a nagging pain in your belly area. °· You continue to feel sick to your stomach (nauseous), throw up (vomit), or have watery poop (diarrhea). °· You have bad smelling fluid coming from your vagina. °· You have pain with peeing (urination). °Get help right away if: °· You have a fever. °· You are leaking fluid from your vagina. °· You are spotting or bleeding from your vagina. °· You have severe belly cramping or pain. °· You lose or gain weight rapidly. °· You have trouble catching your breath and have chest pain. °· You notice sudden or extreme puffiness (swelling) of your face, hands, ankles, feet, or legs. °· You have not felt the baby move in over an hour. °· You have severe headaches that do not go away with medicine. °· You have vision changes. °This information is not intended to replace advice given to you by your health care provider. Make   sure you discuss any questions you have with your health care provider. Document Released: 01/29/2010 Document Revised: 04/11/2016 Document Reviewed: 01/05/2013 Elsevier Interactive Patient Education  2017 [employer] Dizziness Dizziness is a common problem. It is a feeling of unsteadiness or light-headedness. You may feel like you are about to faint. Dizziness can lead to injury if you stumble or fall. Anyone can become dizzy, but dizziness is more common in older adults. This condition can be caused by a number of things, including medicines,  dehydration, or illness. Follow these instructions at home: Eating and drinking  Drink enough fluid to keep your urine clear or pale yellow. This helps to keep you from becoming dehydrated. Try to drink more clear fluids, such as water.  Do not drink alcohol.  Limit your caffeine intake if told to do so by your health care provider. Check ingredients and nutrition facts to see if a food or beverage contains caffeine.  Limit your salt (sodium) intake if told to do so by your health care provider. Check ingredients and nutrition facts to see if a food or beverage contains sodium. Activity  Avoid making quick movements. ? Rise slowly from chairs and steady yourself until you feel okay. ? In the morning, first sit up on the side of the bed. When you feel okay, stand slowly while you hold onto something until you know that your balance is fine.  If you need to stand in one place for a long time, move your legs often. Tighten and relax the muscles in your legs while you are standing.  Do not drive or use heavy machinery if you feel dizzy.  Avoid bending down if you feel dizzy. Place items in your home so that they are easy for you to reach without leaning over. Lifestyle  Do not use any products that contain nicotine or tobacco, such as cigarettes and e-cigarettes. If you need help quitting, ask your health care provider.  Try to reduce your stress level by using methods such as yoga or meditation. Talk with your health care provider if you need help to manage your stress. General instructions  Watch your dizziness for any changes.  Take over-the-counter and prescription medicines only as told by your health care provider. Talk with your health care provider if you think that your dizziness is caused by a medicine that you are taking.  Tell a friend or a family member that you are feeling dizzy. If he or she notices any changes in your behavior, have this person call your health care  provider.  Keep all follow-up visits as told by your health care provider. This is important. Contact a health care provider if:  Your dizziness does not go away.  Your dizziness or light-headedness gets worse.  You feel nauseous.  You have reduced hearing.  You have new symptoms.  You are unsteady on your feet or you feel like the room is spinning. Get help right away if:  You vomit or have diarrhea and are unable to eat or drink anything.  You have problems talking, walking, swallowing, or using your arms, hands, or legs.  You feel generally weak.  You are not thinking clearly or you have trouble forming sentences. It may take a friend or family member to notice this.  You have chest pain, abdominal pain, shortness of breath, or sweating.  Your vision changes.  You have any bleeding.  You have a severe headache.  You have neck pain or a stiff neck.  You have a fever. These symptoms may represent a serious problem that is an emergency. Do not wait to see if the symptoms will go away. Get medical help right away. Call your local emergency services (911 in the [place] Do not drive yourself to the hospital. Summary  Dizziness is a feeling of unsteadiness or light-headedness. This condition can be caused by a number of things, including medicines, dehydration, or illness.  Anyone can become dizzy, but dizziness is more common in older adults.  Drink enough fluid to keep your urine clear or pale yellow. Do not drink alcohol.  Avoid making quick movements if you feel dizzy. Monitor your dizziness for any changes. This information is not intended to replace advice given to you by your health care provider. Make sure you discuss any questions you have with your health care provider. Document Released: 04/30/2001 Document Revised: 12/07/2016 Document Reviewed: 12/07/2016 Elsevier Interactive Patient Education  [employer].

## 2018-10-08 NOTE — MAU Note (Signed)
Pt stated she has been feeling dizzy the past few days. Noted her feet are swollen as well. Went to check her b/p and it was 94/47. Fetal movement has been less than usual.

## 2018-10-08 NOTE — MAU Provider Note (Signed)
Chief Complaint:  Dizziness   First Provider Initiated Contact with Patient 10/08/18 2035     HPI: Katelyn Cox is a 27 y.o. G4P3003 at 52w2dwho presents to maternity admissions reporting feeling dizzy for two days and new onset of swollen feet.  Took BP and it was low.  States FM is lower than usual also.   Has intermittent contractions.. She denies LOF, vaginal bleeding, vaginal itching/burning, urinary symptoms, h/a, n/v, diarrhea, constipation or fever/chills.  Has a history remarkable for gastric sleeve surgery.  States watches diet very carefully and thinks she drinks enough  Dizziness  This is a new problem. The current episode started today. The problem occurs intermittently. Pertinent negatives include no abdominal pain, chills, congestion, fatigue, fever, headaches, nausea, visual change or vomiting. The symptoms are aggravated by walking. She has tried nothing for the symptoms.   RN Note: Pt stated she has been feeling dizzy the past few days. Noted her feet are swollen as well. Went to check her b/p and it was 94/47. Fetal movement has been less than usual  Past Medical History: Past Medical History:  Diagnosis Date  . Medical history non-contributory     Past obstetric history: OB History  Gravida Para Term Preterm AB Living  4 3 3     3   SAB TAB Ectopic Multiple Live Births               # Outcome Date GA Lbr Len/2nd Weight Sex Delivery Anes PTL Lv  4 Current           3 Term           2 Term           1 Term             Past Surgical History: Past Surgical History:  Procedure Laterality Date  . GASTRECTOMY     vertical sleeve gastrectomy    Family History: Family History  Problem Relation Age of Onset  . Leukemia Mother   . Throat cancer Maternal Grandmother     Social History: Social History   Tobacco Use  . Smoking status: Never Smoker  . Smokeless tobacco: Never Used  Substance Use Topics  . Alcohol use: Yes    Alcohol/week: 1.0 standard  drinks    Types: 1 Glasses of wine per week  . Drug use: No    Allergies: No Known Allergies  Meds:  Medications Prior to Admission  Medication Sig Dispense Refill Last Dose  . ondansetron (ZOFRAN ODT) 4 MG disintegrating tablet Take 1 tablet (4 mg total) by mouth every 8 (eight) hours as needed for nausea or vomiting. 20 tablet 0 10/08/2018 at Unknown time  . traMADol (ULTRAM) 50 MG tablet Take 1 tablet (50 mg total) by mouth every 6 (six) hours as needed for moderate pain. (Patient not taking: Reported on 09/08/2018) 15 tablet 0 Unknown at Unknown time    I have reviewed patient's Past Medical Hx, Surgical Hx, Family Hx, Social Hx, medications and allergies.   ROS:  Review of Systems  Constitutional: Negative for chills, fatigue and fever.  HENT: Negative for congestion.   Gastrointestinal: Negative for abdominal pain, nausea and vomiting.  Neurological: Positive for dizziness. Negative for headaches.   Other systems negative  Physical Exam   Patient Vitals for the past 24 hrs:  BP Temp Pulse Resp Height Weight  10/08/18 2014 (!) 107/56 98.3 F (36.8 C) 67 18 5\' 4"  (1.626 m) 88.5 kg   Vitals:  10/08/18 2056 10/08/18 2059 10/08/18 2103 10/08/18 2212  BP: (!) 98/51 (!) 105/54 (!) 107/57 (!) 105/53  Pulse: 73 61 81   Resp:      Temp:      Weight:      Height:                                   Lying                            Sitting                          Standing  Constitutional: Well-developed, well-nourished female in no acute distress.  Cardiovascular: normal rate and rhythm Respiratory: normal effort, clear to auscultation bilaterally GI: Abd soft, non-tender, gravid appropriate for gestational age.   No rebound or guarding. MS: Extremities nontender, 1+ lower extremity edema, normal ROM Neurologic: Alert and oriented x 4.  DTRs normal  GU: Neg CVAT.  PELVIC EXAM: deferred   FHT:  Baseline 120-130, moderate variability, accelerations present, no  decelerations   Fetus very active Contractions:   Uterine irritability   Labs: Results for orders placed or performed during the hospital encounter of 10/08/18 (from the past 24 hour(s))  CBC     Status: Abnormal   Collection Time: 10/08/18  8:47 PM  Result Value Ref Range   WBC 7.0 4.0 - 10.5 K/uL   RBC 3.66 (L) 3.87 - 5.11 MIL/uL   Hemoglobin 11.4 (L) 12.0 - 15.0 g/dL   HCT 27.234.6 (L) 53.636.0 - 64.446.0 %   MCV 94.5 80.0 - 100.0 fL   MCH 31.1 26.0 - 34.0 pg   MCHC 32.9 30.0 - 36.0 g/dL   RDW 03.413.2 74.211.5 - 59.515.5 %   Platelets 230 150 - 400 K/uL   nRBC 0.0 0.0 - 0.2 %     Imaging:  No results found.  MAU Course/MDM: I have ordered labs and reviewed results. Hemoglobin stable (was 11.4 in September in office) NST reviewed and is reactive.  Uterine irritability, pt not feeling it much.  Treatments in MAU included EFM, orthostatic VS  Discussed the heart rate change from 60s-80s may reflect some mild dehydration.  Offered patient IV hydration but she prefers to orally hydrate at home.  . Discussed edema is likely dependent edema.  In the absence of hypertension, it is not associated with preeclampsia   Assessment: Single intrauterine pregnancy at 7661w3d Dizziness Mild dehydration,  Pedal edema   Plan: Discharge home Oral hydration until urine color is pale Labor precautions and fetal kick counts Follow up in Office for prenatal visits and recheck of status  Encouraged to return here or to other Urgent Care/ED if she develops worsening of symptoms, increase in pain, fever, or other concerning symptoms.   Pt stable at time of discharge.  Wynelle BourgeoisMarie Tanetta Fuhriman CNM, MSN Certified Nurse-Midwife 10/08/2018 8:56 PM

## 2018-10-09 DIAGNOSIS — R42 Dizziness and giddiness: Secondary | ICD-10-CM

## 2018-10-09 DIAGNOSIS — R600 Localized edema: Secondary | ICD-10-CM | POA: Diagnosis present

## 2018-10-21 DIAGNOSIS — N898 Other specified noninflammatory disorders of vagina: Secondary | ICD-10-CM | POA: Diagnosis not present

## 2018-10-21 DIAGNOSIS — R300 Dysuria: Secondary | ICD-10-CM | POA: Diagnosis not present

## 2018-10-21 DIAGNOSIS — O26893 Other specified pregnancy related conditions, third trimester: Secondary | ICD-10-CM | POA: Diagnosis not present

## 2018-10-21 DIAGNOSIS — Z3685 Encounter for antenatal screening for Streptococcus B: Secondary | ICD-10-CM | POA: Diagnosis not present

## 2018-10-21 LAB — OB RESULTS CONSOLE GBS: STREP GROUP B AG: NEGATIVE

## 2018-11-03 ENCOUNTER — Telehealth (HOSPITAL_COMMUNITY): Payer: Self-pay | Admitting: *Deleted

## 2018-11-03 ENCOUNTER — Encounter (HOSPITAL_COMMUNITY): Payer: Self-pay | Admitting: *Deleted

## 2018-11-03 ENCOUNTER — Other Ambulatory Visit: Payer: Self-pay | Admitting: Obstetrics

## 2018-11-03 NOTE — Telephone Encounter (Signed)
Preadmission screen  

## 2018-11-08 ENCOUNTER — Inpatient Hospital Stay (HOSPITAL_COMMUNITY)
Admission: AD | Admit: 2018-11-08 | Discharge: 2018-11-08 | Disposition: A | Discharge status: Home / Self Care | Payer: 59 | Source: Ambulatory Visit | Attending: Obstetrics | Admitting: Obstetrics

## 2018-11-08 ENCOUNTER — Encounter (HOSPITAL_COMMUNITY): Payer: Self-pay | Admitting: *Deleted

## 2018-11-08 DIAGNOSIS — O26893 Other specified pregnancy related conditions, third trimester: Secondary | ICD-10-CM

## 2018-11-08 DIAGNOSIS — L299 Pruritus, unspecified: Secondary | ICD-10-CM | POA: Diagnosis not present

## 2018-11-08 DIAGNOSIS — Z3A37 37 weeks gestation of pregnancy: Secondary | ICD-10-CM

## 2018-11-08 DIAGNOSIS — Z0371 Encounter for suspected problem with amniotic cavity and membrane ruled out: Secondary | ICD-10-CM

## 2018-11-08 LAB — COMPREHENSIVE METABOLIC PANEL
ALK PHOS: 75 U/L (ref 38–126)
ALT: 11 U/L (ref 0–44)
AST: 15 U/L (ref 15–41)
Albumin: 3.1 g/dL — ABNORMAL LOW (ref 3.5–5.0)
Anion gap: 7 (ref 5–15)
BUN: 8 mg/dL (ref 6–20)
CO2: 22 mmol/L (ref 22–32)
Calcium: 8.7 mg/dL — ABNORMAL LOW (ref 8.9–10.3)
Chloride: 105 mmol/L (ref 98–111)
Creatinine, Ser: 0.7 mg/dL (ref 0.44–1.00)
GFR calc Af Amer: >60 mL/min (ref >60)
GFR calc non Af Amer: >60 mL/min (ref >60)
Glucose, Bld: 75 mg/dL (ref 70–99)
Potassium: 4.1 mmol/L (ref 3.5–5.1)
SODIUM: 134 mmol/L — AB (ref 135–145)
Total Bilirubin: 0.6 mg/dL (ref 0.3–1.2)
Total Protein: 6 g/dL — ABNORMAL LOW (ref 6.5–8.1)

## 2018-11-08 LAB — URINALYSIS, ROUTINE W REFLEX MICROSCOPIC
Bilirubin Urine: NEGATIVE
Glucose, UA: NEGATIVE mg/dL
Hgb urine dipstick: NEGATIVE
Ketones, ur: NEGATIVE mg/dL
Nitrite: NEGATIVE
PROTEIN: NEGATIVE mg/dL
Specific Gravity, Urine: 1.006 (ref 1.005–1.030)
pH: 6 (ref 5.0–8.0)

## 2018-11-08 LAB — CBC
HCT: 36.4 % (ref 36.0–46.0)
HEMOGLOBIN: 12.1 g/dL (ref 12.0–15.0)
MCH: 31.7 pg (ref 26.0–34.0)
MCHC: 33.2 g/dL (ref 30.0–36.0)
MCV: 95.3 fL (ref 80.0–100.0)
Platelets: 237 10*3/uL (ref 150–400)
RBC: 3.82 MIL/uL — ABNORMAL LOW (ref 3.87–5.11)
RDW: 13.1 % (ref 11.5–15.5)
WBC: 6.9 10*3/uL (ref 4.0–10.5)
nRBC: 0 % (ref 0.0–0.2)

## 2018-11-08 NOTE — MAU Provider Note (Signed)
Chief Complaint:  Pruritis   First Provider Initiated Contact with Patient 11/08/18 1206      HPI: Katelyn Cox is a 27 y.o. G4P3003 at 105w5dwho presents to maternity admissions reporting itching x 1 week on her arms, legs, face, abdomen, and back.  There is no rash associated with the itching. She has tried oatmeal baths and lotion but the itching is still present. It is keeping her from sleeping. She denies any other symptoms.  She reports good fetal movement.  HPI  Past Medical History: Past Medical History:  Diagnosis Date  . History of idiopathic intracranial hypertension   . Medical history non-contributory     Past obstetric history: OB History  Gravida Para Term Preterm AB Living  '4 3 3     3  '$ SAB TAB Ectopic Multiple Live Births               # Outcome Date GA Lbr Len/2nd Weight Sex Delivery Anes PTL Lv  4 Current           3 Term      Vag-Spont     2 Term      Vag-Spont     1 Term      Vag-Spont       Past Surgical History: Past Surgical History:  Procedure Laterality Date  . GASTRECTOMY     vertical sleeve gastrectomy    Family History: Family History  Problem Relation Age of Onset  . Leukemia Mother   . Throat cancer Maternal Grandmother   . Hypertension Maternal Grandmother   . Varicose Veins Maternal Grandmother     Social History: Social History   Tobacco Use  . Smoking status: Never Smoker  . Smokeless tobacco: Never Used  Substance Use Topics  . Alcohol use: Yes    Alcohol/week: 1.0 standard drinks    Types: 1 Glasses of wine per week  . Drug use: No    Allergies: No Known Allergies  Meds:  No medications prior to admission.    ROS:  Review of Systems  Constitutional: Negative for chills, fatigue and fever.  Eyes: Negative for visual disturbance.  Respiratory: Negative for shortness of breath.   Cardiovascular: Negative for chest pain.  Gastrointestinal: Negative for abdominal pain, nausea and vomiting.  Genitourinary:  Negative for difficulty urinating, dysuria, flank pain, pelvic pain, vaginal bleeding, vaginal discharge and vaginal pain.  Skin:       Generalized itching  Neurological: Negative for dizziness and headaches.  Psychiatric/Behavioral: Negative.      I have reviewed patient's Past Medical Hx, Surgical Hx, Family Hx, Social Hx, medications and allergies.   Physical Exam   Patient Vitals for the past 24 hrs:  BP Temp Temp src Pulse Resp Height Weight  11/08/18 1124 (!) 110/56 98.4 F (36.9 C) Oral 79 18 '5\' 4"'$  (1.626 m) 89 kg   Constitutional: Well-developed, well-nourished female in no acute distress.  Cardiovascular: normal rate Respiratory: normal effort GI: Abd soft, non-tender, gravid appropriate for gestational age.  MS: Extremities nontender, no edema, normal ROM Neurologic: Alert and oriented x 4.  GU: Neg CVAT.     FHT:  Baseline 135 , moderate variability, accelerations present, no decelerations Contractions: irritability, mild to palpation   Labs: Results for orders placed or performed during the hospital encounter of 11/08/18 (from the past 24 hour(s))  Urinalysis, Routine w reflex microscopic     Status: Abnormal   Collection Time: 11/08/18 11:45 AM  Result Value Ref Range  Color, Urine YELLOW YELLOW   APPearance CLEAR CLEAR   Specific Gravity, Urine 1.006 1.005 - 1.030   pH 6.0 5.0 - 8.0   Glucose, UA NEGATIVE NEGATIVE mg/dL   Hgb urine dipstick NEGATIVE NEGATIVE   Bilirubin Urine NEGATIVE NEGATIVE   Ketones, ur NEGATIVE NEGATIVE mg/dL   Protein, ur NEGATIVE NEGATIVE mg/dL   Nitrite NEGATIVE NEGATIVE   Leukocytes, UA LARGE (A) NEGATIVE   RBC / HPF 0-5 0 - 5 RBC/hpf   WBC, UA 21-50 0 - 5 WBC/hpf   Bacteria, UA MANY (A) NONE SEEN   Squamous Epithelial / LPF 6-10 0 - 5   Mucus PRESENT   CBC     Status: Abnormal   Collection Time: 11/08/18 12:05 PM  Result Value Ref Range   WBC 6.9 4.0 - 10.5 K/uL   RBC 3.82 (L) 3.87 - 5.11 MIL/uL   Hemoglobin 12.1 12.0 -  15.0 g/dL   HCT 36.4 36.0 - 46.0 %   MCV 95.3 80.0 - 100.0 fL   MCH 31.7 26.0 - 34.0 pg   MCHC 33.2 30.0 - 36.0 g/dL   RDW 13.1 11.5 - 15.5 %   Platelets 237 150 - 400 K/uL   nRBC 0.0 0.0 - 0.2 %  Comprehensive metabolic panel     Status: Abnormal   Collection Time: 11/08/18 12:05 PM  Result Value Ref Range   Sodium 134 (L) 135 - 145 mmol/L   Potassium 4.1 3.5 - 5.1 mmol/L   Chloride 105 98 - 111 mmol/L   CO2 22 22 - 32 mmol/L   Glucose, Bld 75 70 - 99 mg/dL   BUN 8 6 - 20 mg/dL   Creatinine, Ser 0.70 0.44 - 1.00 mg/dL   Calcium 8.7 (L) 8.9 - 10.3 mg/dL   Total Protein 6.0 (L) 6.5 - 8.1 g/dL   Albumin 3.1 (L) 3.5 - 5.0 g/dL   AST 15 15 - 41 U/L   ALT 11 0 - 44 U/L   Alkaline Phosphatase 75 38 - 126 U/L   Total Bilirubin 0.6 0.3 - 1.2 mg/dL   GFR calc non Af Amer >60 >60 mL/min   GFR calc Af Amer >60 >60 mL/min   Anion gap 7 5 - 15   A/Positive/-- (06/05 0000)  Imaging:  No results found.  MAU Course/MDM: CMP with normal liver enzymes today, CBC wnl Bile acids pending, ordered stat NST reviewed and reactive Pt with negative pooling/ferning and no additional LOF, no evidence of ROM Pt declined treatment in MAU or  Rx for antihistamine today.  Consult Dr Harolyn Rutherford with presentation, exam findings and test results.  With normal liver enzymes, generalized itching but not as much on hands and feet, and bile acids pending, recommend close follow up outpatient, IOL if bile acids elevated. Message sent to [place] and pt aware of need for appt on 12/23.  Reviewed fetal movement counting/reasons to return to MAU Pt discharge with strict return precautions.   Assessment: 1. Pruritus   2. Pregnancy with 37 weeks completed gestation   3. Encounter for suspected premature rupture of amniotic membranes, with rupture of membranes not found     Plan: Discharge home Labor precautions and fetal kick counts Follow-up Information    [facility], [facility]. Schedule an appointment  as soon as possible for a visit.   Why:  On 11/09/18 to see provider, review lab results.  Return to MAU as needed for emergencies. Contact information: [address] [place] [address] [phone]  Allergies as of 11/08/2018   No Known Allergies     Medication List    STOP taking these medications   traMADol 50 MG tablet Commonly known as:  ULTRAM     TAKE these medications   ondansetron 4 MG disintegrating tablet Commonly known as:  ZOFRAN ODT Take 1 tablet (4 mg total) by mouth every 8 (eight) hours as needed for nausea or vomiting.   ONE TOUCH ULTRA 2 w/Device Kit U UTD   ONE TOUCH ULTRA TEST test strip Generic drug:  glucose blood TEST BLOOD SUGAR QID UTD   ONETOUCH DELICA PLUS [id number] Misc USE TO TEST BLOOD SUGAR QID UTD       Fatima Blank Certified Nurse-Midwife 11/09/2018 8:21 AM

## 2018-11-08 NOTE — MAU Note (Signed)
Reports body itching for the past week  Hx of gastric sleeve  No pain, no bleeding, no LOF, no discharge, + FM

## 2018-11-09 DIAGNOSIS — O26893 Other specified pregnancy related conditions, third trimester: Secondary | ICD-10-CM | POA: Diagnosis not present

## 2018-11-09 DIAGNOSIS — Z3A37 37 weeks gestation of pregnancy: Secondary | ICD-10-CM | POA: Diagnosis not present

## 2018-11-09 LAB — BILE ACIDS, TOTAL: Bile Acids Total: 4 umol/L (ref 0.0–10.0)

## 2018-11-10 LAB — CULTURE, OB URINE: Culture: 70000 — AB

## 2018-11-17 ENCOUNTER — Encounter (HOSPITAL_COMMUNITY): Payer: Self-pay

## 2018-11-17 ENCOUNTER — Inpatient Hospital Stay (HOSPITAL_COMMUNITY): Payer: 59 | Admitting: Anesthesiology

## 2018-11-17 ENCOUNTER — Inpatient Hospital Stay (HOSPITAL_COMMUNITY)
Admission: RE | Admit: 2018-11-17 | Discharge: 2018-11-18 | DRG: 807 | Disposition: A | Discharge status: Home / Self Care | Payer: 59 | Attending: Obstetrics | Admitting: Obstetrics

## 2018-11-17 ENCOUNTER — Other Ambulatory Visit: Payer: Self-pay

## 2018-11-17 DIAGNOSIS — Z3A39 39 weeks gestation of pregnancy: Secondary | ICD-10-CM

## 2018-11-17 DIAGNOSIS — O99844 Bariatric surgery status complicating childbirth: Secondary | ICD-10-CM | POA: Diagnosis present

## 2018-11-17 DIAGNOSIS — Z0279 Encounter for issue of other medical certificate: Secondary | ICD-10-CM | POA: Diagnosis present

## 2018-11-17 DIAGNOSIS — O9902 Anemia complicating childbirth: Secondary | ICD-10-CM | POA: Diagnosis present

## 2018-11-17 DIAGNOSIS — D509 Iron deficiency anemia, unspecified: Secondary | ICD-10-CM | POA: Diagnosis present

## 2018-11-17 DIAGNOSIS — R42 Dizziness and giddiness: Secondary | ICD-10-CM

## 2018-11-17 DIAGNOSIS — Z3483 Encounter for supervision of other normal pregnancy, third trimester: Secondary | ICD-10-CM | POA: Diagnosis not present

## 2018-11-17 DIAGNOSIS — Z3490 Encounter for supervision of normal pregnancy, unspecified, unspecified trimester: Secondary | ICD-10-CM | POA: Diagnosis present

## 2018-11-17 DIAGNOSIS — R600 Localized edema: Secondary | ICD-10-CM

## 2018-11-17 LAB — TYPE AND SCREEN
ABO/RH(D): A POS
Antibody Screen: NEGATIVE

## 2018-11-17 LAB — CBC
HCT: 36.1 % (ref 36.0–46.0)
Hemoglobin: 11.9 g/dL — ABNORMAL LOW (ref 12.0–15.0)
MCH: 31.4 pg (ref 26.0–34.0)
MCHC: 33 g/dL (ref 30.0–36.0)
MCV: 95.3 fL (ref 80.0–100.0)
PLATELETS: 247 10*3/uL (ref 150–400)
RBC: 3.79 MIL/uL — ABNORMAL LOW (ref 3.87–5.11)
RDW: 13 % (ref 11.5–15.5)
WBC: 8.7 10*3/uL (ref 4.0–10.5)
nRBC: 0 % (ref 0.0–0.2)

## 2018-11-17 LAB — ABO/RH: ABO/RH(D): A POS

## 2018-11-17 LAB — RPR: RPR Ser Ql: NONREACTIVE

## 2018-11-17 MED ORDER — ACETAMINOPHEN 325 MG PO TABS: 650.0000 mg | ORAL_TABLET | ORAL | Status: DC | PRN

## 2018-11-17 MED ORDER — OXYTOCIN BOLUS FROM INFUSION
500.0000 mL | Freq: Once | INTRAVENOUS | Status: AC
  Administered 2018-11-17: 500 mL via INTRAVENOUS

## 2018-11-17 MED ORDER — ONDANSETRON HCL 4 MG/2ML IJ SOLN
4.0000 mg | Freq: Four times a day (QID) | INTRAMUSCULAR | Status: DC | PRN
  Administered 2018-11-17: 4 mg via INTRAVENOUS
  Filled 2018-11-17: qty 2

## 2018-11-17 MED ORDER — EPHEDRINE 5 MG/ML INJ
10.0000 mg | INTRAVENOUS | Status: DC | PRN
  Filled 2018-11-17: qty 2

## 2018-11-17 MED ORDER — ACETAMINOPHEN 325 MG PO TABS
650.0000 mg | ORAL_TABLET | ORAL | Status: DC | PRN
  Administered 2018-11-17 – 2018-11-18 (×4): 650 mg via ORAL
  Filled 2018-11-17 (×5): qty 2

## 2018-11-17 MED ORDER — LIDOCAINE HCL (PF) 1 % IJ SOLN
INTRAMUSCULAR | Status: DC | PRN
  Administered 2018-11-17 (×2): 5 mL via EPIDURAL

## 2018-11-17 MED ORDER — OXYTOCIN 40 UNITS IN LACTATED RINGERS INFUSION - SIMPLE MED: 2.5000 [IU]/h | INTRAVENOUS | Status: DC

## 2018-11-17 MED ORDER — IBUPROFEN 600 MG PO TABS
600.0000 mg | ORAL_TABLET | Freq: Four times a day (QID) | ORAL | Status: DC
  Filled 2018-11-17: qty 1

## 2018-11-17 MED ORDER — DIBUCAINE 1 % RE OINT: 1.0000 "application " | TOPICAL_OINTMENT | RECTAL | Status: DC | PRN

## 2018-11-17 MED ORDER — PHENYLEPHRINE 40 MCG/ML (10ML) SYRINGE FOR IV PUSH (FOR BLOOD PRESSURE SUPPORT)
80.0000 ug | PREFILLED_SYRINGE | INTRAVENOUS | Status: DC | PRN
  Filled 2018-11-17 (×2): qty 10

## 2018-11-17 MED ORDER — ERYTHROMYCIN 5 MG/GM OP OINT
TOPICAL_OINTMENT | OPHTHALMIC | Status: AC
  Filled 2018-11-17: qty 1

## 2018-11-17 MED ORDER — ONDANSETRON HCL 4 MG PO TABS: 4.0000 mg | ORAL_TABLET | ORAL | Status: DC | PRN

## 2018-11-17 MED ORDER — PHENYLEPHRINE 40 MCG/ML (10ML) SYRINGE FOR IV PUSH (FOR BLOOD PRESSURE SUPPORT)
80.0000 ug | PREFILLED_SYRINGE | INTRAVENOUS | Status: DC | PRN
  Filled 2018-11-17: qty 10

## 2018-11-17 MED ORDER — COCONUT OIL OIL: 1.0000 "application " | TOPICAL_OIL | Status: DC | PRN

## 2018-11-17 MED ORDER — OXYCODONE HCL 5 MG PO TABS: 5.0000 mg | ORAL_TABLET | ORAL | Status: DC | PRN

## 2018-11-17 MED ORDER — SOD CITRATE-CITRIC ACID 500-334 MG/5ML PO SOLN: 30.0000 mL | ORAL | Status: DC | PRN

## 2018-11-17 MED ORDER — ONDANSETRON HCL 4 MG/2ML IJ SOLN: 4.0000 mg | INTRAMUSCULAR | Status: DC | PRN

## 2018-11-17 MED ORDER — LACTATED RINGERS IV SOLN: 500.0000 mL | Freq: Once | INTRAVENOUS | Status: DC

## 2018-11-17 MED ORDER — BENZOCAINE-MENTHOL 20-0.5 % EX AERO
1.0000 "application " | INHALATION_SPRAY | CUTANEOUS | Status: DC | PRN
  Administered 2018-11-17: 1 via TOPICAL
  Filled 2018-11-17: qty 56

## 2018-11-17 MED ORDER — PRENATAL MULTIVITAMIN CH
1.0000 | ORAL_TABLET | Freq: Every day | ORAL | Status: DC
  Administered 2018-11-18: 1 via ORAL
  Filled 2018-11-17: qty 1

## 2018-11-17 MED ORDER — LIDOCAINE HCL (PF) 1 % IJ SOLN
30.0000 mL | INTRAMUSCULAR | Status: DC | PRN
  Filled 2018-11-17: qty 30

## 2018-11-17 MED ORDER — SENNOSIDES-DOCUSATE SODIUM 8.6-50 MG PO TABS
2.0000 | ORAL_TABLET | ORAL | Status: DC
  Administered 2018-11-18: 2 via ORAL
  Filled 2018-11-17 (×2): qty 2

## 2018-11-17 MED ORDER — OXYTOCIN 40 UNITS IN LACTATED RINGERS INFUSION - SIMPLE MED
1.0000 m[IU]/min | INTRAVENOUS | Status: DC
  Administered 2018-11-17: 2 m[IU]/min via INTRAVENOUS
  Filled 2018-11-17: qty 1000

## 2018-11-17 MED ORDER — WITCH HAZEL-GLYCERIN EX PADS
1.0000 "application " | MEDICATED_PAD | CUTANEOUS | Status: DC | PRN
  Administered 2018-11-17: 1 via TOPICAL

## 2018-11-17 MED ORDER — ZOLPIDEM TARTRATE 5 MG PO TABS: 5.0000 mg | ORAL_TABLET | Freq: Every evening | ORAL | Status: DC | PRN

## 2018-11-17 MED ORDER — FENTANYL 2.5 MCG/ML BUPIVACAINE 1/10 % EPIDURAL INFUSION (WH - ANES)
14.0000 mL/h | INTRAMUSCULAR | Status: DC | PRN
  Administered 2018-11-17: 14 mL/h via EPIDURAL
  Filled 2018-11-17: qty 100

## 2018-11-17 MED ORDER — DIPHENHYDRAMINE HCL 50 MG/ML IJ SOLN: 12.5000 mg | INTRAMUSCULAR | Status: DC | PRN

## 2018-11-17 MED ORDER — OXYCODONE HCL 5 MG PO TABS: 10.0000 mg | ORAL_TABLET | ORAL | Status: DC | PRN

## 2018-11-17 MED ORDER — LACTATED RINGERS IV SOLN: 500.0000 mL | INTRAVENOUS | Status: DC | PRN

## 2018-11-17 MED ORDER — SIMETHICONE 80 MG PO CHEW: 80.0000 mg | CHEWABLE_TABLET | ORAL | Status: DC | PRN

## 2018-11-17 MED ORDER — TERBUTALINE SULFATE 1 MG/ML IJ SOLN
0.2500 mg | Freq: Once | INTRAMUSCULAR | Status: DC | PRN
  Filled 2018-11-17: qty 1

## 2018-11-17 MED ORDER — DIPHENHYDRAMINE HCL 25 MG PO CAPS: 25.0000 mg | ORAL_CAPSULE | Freq: Four times a day (QID) | ORAL | Status: DC | PRN

## 2018-11-17 MED ORDER — LACTATED RINGERS IV SOLN
INTRAVENOUS | Status: DC
  Administered 2018-11-17: 08:00:00 via INTRAVENOUS

## 2018-11-17 MED ORDER — TETANUS-DIPHTH-ACELL PERTUSSIS 5-2.5-18.5 LF-MCG/0.5 IM SUSP: 0.5000 mL | Freq: Once | INTRAMUSCULAR | Status: DC

## 2018-11-17 NOTE — Anesthesia Preprocedure Evaluation (Signed)

## 2018-11-17 NOTE — Progress Notes (Addendum)
Comfortable with epidural  Reactive fetal testing Cvx: 4/50%/ -3  A/P:  IOL, cont pit  Lendon ColonelKelly A Shamus Desantis 11/17/2018 1:09 PM    S/p AROM around 12p

## 2018-11-17 NOTE — H&P (Signed)
Katelyn Cox is a 27 y.o. G4P3003 at 928w0d presenting for elective IOL at term, h/o rapid labors. Pt notes onset contractions with start of pitocin . Good fetal movement, No vaginal bleeding, not leaking fluid.  [facility] 1st [facility] - Dated by early u/s, <9 wks - h/o gastric sleeve - iron deficiency, s/p IV iron, stable Hgb   Prenatal Transfer Tool  Maternal Diabetes: No Genetic Screening: Normal Maternal Ultrasounds/Referrals: Normal Fetal Ultrasounds or other Referrals:  None Maternal Substance Abuse:  No Significant Maternal Medications:  None Significant Maternal Lab Results: None     OB History    Gravida  4   Para  3   Term  3   Preterm      AB      Living  3     SAB      TAB      Ectopic      Multiple      Live Births             Past Medical History:  Diagnosis Date  . History of idiopathic intracranial hypertension   . Medical history non-contributory    Past Surgical History:  Procedure Laterality Date  . GASTRECTOMY     vertical sleeve gastrectomy   Family History: family history includes Hypertension in her maternal grandmother; Leukemia in her mother; Throat cancer in her maternal grandmother; Varicose Veins in her maternal grandmother. Social History:  reports that she has never smoked. She has never used smokeless tobacco. She reports current alcohol use of about 1.0 standard drinks of alcohol per week. She reports that she does not use drugs.  Review of Systems - Negative except cramping   Dilation: 2.5 Effacement (%): 50 Station: Ballotable Exam by:: S. Oklesh RN Blood pressure (!) 116/55, pulse 76, temperature 98.5 F (36.9 C), temperature source Oral, resp. rate 18, height 5\' 4"  (1.626 m), weight 90.4 kg, last menstrual period 10/24/2017.  Physical Exam:  Gen: well appearing, no distress Abd: gravid, NT, no RUQ pain LE: no edema, equal bilaterally, non-tender Toco: irritibility FH: baseline 130s,  accelerations present, no deceleratons, 10 beat variability  Prenatal labs: ABO, Rh: --/--/A POS (12/31 [id number]) Antibody: PENDING (12/31 0752) Rubella: Immune (06/05 0000) RPR: Nonreactive (06/05 0000)  HBsAg: Negative (06/05 0000)  HIV: Non-reactive (06/05 0000)  GBS: Negative (12/04 0000)  1 hr Glucola declined due to gastric surgery, normal qid BS x 4d each month  Genetic screening nl Panorama, nl AFP Anatomy US normal  CBC    Component Value Date/Time   WBC 6.9 11/08/2018 1205   RBC 3.82 (L) 11/08/2018 1205   HGB 12.1 11/08/2018 1205   HCT 36.4 11/08/2018 1205   PLT 237 11/08/2018 1205   MCV 95.3 11/08/2018 1205   MCH 31.7 11/08/2018 1205   MCHC 33.2 11/08/2018 1205   RDW 13.1 11/08/2018 1205    Assessment/Plan: 27 y.o. G4P3003 at 6028w0d IOL at term, elective, plan pitocin, AROM when active Planning epidural Reactive fetal testing   Lendon ColonelKelly A Hershel Corkery 11/17/2018, 8:49 AM

## 2018-11-17 NOTE — Anesthesia Procedure Notes (Signed)
Epidural Patient location during procedure: OB  Staffing Anesthesiologist: Zuma Hust, MD Performed: anesthesiologist   Preanesthetic Checklist Completed: patient identified, site marked, surgical consent, pre-op evaluation, timeout performed, IV checked, risks and benefits discussed and monitors and equipment checked  Epidural Patient position: sitting Prep: DuraPrep Patient monitoring: heart rate, continuous pulse ox and blood pressure Approach: right paramedian Location: L3-L4 Injection technique: LOR saline  Needle:  Needle type: Tuohy  Needle gauge: 17 G Needle length: 9 cm and 9 Needle insertion depth: 5 cm Catheter type: closed end flexible Catheter size: 20 Guage Catheter at skin depth: 9 cm Test dose: negative  Assessment Events: blood not aspirated, injection not painful, no injection resistance, negative IV test and no paresthesia  Additional Notes Patient identified. Risks/Benefits/Options discussed with patient including but not limited to bleeding, infection, nerve damage, paralysis, failed block, incomplete pain control, headache, blood pressure changes, nausea, vomiting, reactions to medication both or allergic, itching and postpartum back pain. Confirmed with bedside nurse the patient's most recent platelet count. Confirmed with patient that they are not currently taking any anticoagulation, have any bleeding history or any family history of bleeding disorders. Patient expressed understanding and wished to proceed. All questions were answered. Sterile technique was used throughout the entire procedure. Please see nursing notes for vital signs. Test dose was given through epidural needle and negative prior to continuing to dose epidural or start infusion. Warning signs of high block given to the patient including shortness of breath, tingling/numbness in hands, complete motor block, or any concerning symptoms with instructions to call for help. Patient was given  instructions on fall risk and not to get out of bed. All questions and concerns addressed with instructions to call with any issues.     

## 2018-11-17 NOTE — Anesthesia Pain Management Evaluation Note (Signed)
  CRNA Pain Management Visit Note  Patient: Katelyn Cox, 27 y.o., female  "Hello I am a member of the anesthesia team at [facility]. We have an anesthesia team available at all times to provide care throughout the hospital, including epidural management and anesthesia for C-section. I don't know your plan for the delivery whether it a natural birth, water birth, IV sedation, nitrous supplementation, doula or epidural, but we want to meet your pain goals."   1.Was your pain managed to your expectations on prior hospitalizations?   Yes   2.What is your expectation for pain management during this hospitalization?     Epidural and IV pain meds  3.How can we help you reach that goal? Be available  Record the patient's initial score and the patient's pain goal.   Pain: 3  Pain Goal: 5 [facility] wants you to be able to say your pain was always managed very well.  [facility] SystemMERRITT,Teresha Hanks 11/17/2018

## 2018-11-18 MED ORDER — IBUPROFEN 600 MG PO TABS: 600.0000 mg | ORAL_TABLET | Freq: Four times a day (QID) | ORAL | 0 refills | Status: DC

## 2018-11-18 NOTE — Discharge Summary (Signed)
Obstetric Discharge Summary   Patient Name: Katelyn Cox DOB: 1991/07/08 MRN: [id number]  Date of Admission: 11/17/2018 Date of Discharge: 11/18/2018 Date of Delivery: 11/17/18 Gestational Age at Delivery: [redacted]w[redacted]d Primary OB: [facility] - Dr. FPamala Hurry  Antepartum complications:  - Dated by early u/s, <9 wks - h/o gastric sleeve - iron deficiency, s/p IV iron, stable Hgb  Prenatal Labs:  ABO, Rh: --/--/A POS (12/31 02505 Antibody: PENDING (12/31 03976 Rubella: Immune (06/05 0000) RPR: Nonreactive (06/05 0000)  HBsAg: Negative (06/05 0000)  HIV: Non-reactive (06/05 0000)  GBS: Negative (12/04 0000)  1 hr Glucola declined due to gastric surgery, normal qid BS x 4d each month         Genetic screening nl Panorama, nl AFP Anatomy UKoreanormal  Admitting Diagnosis: Elective IOL at 39 weeks   Secondary Diagnoses: Patient Active Problem List   Diagnosis Date Noted  . Encounter for planned induction of labor 11/17/2018  . SVD (spontaneous vaginal delivery) 11/17/2018  . Postpartum care following vaginal delivery (12/31) 11/17/2018  . Orthostatic dizziness 10/09/2018  . Lower extremity edema 10/09/2018  . [redacted] weeks gestation of pregnancy 09/09/2018  . History of bariatric surgery 12/11/2016  . Visit for gynecologic examination 12/11/2016   Induction: Pitocin Augmentation: AROM Complications: None  Date of Delivery: 11/17/18 Delivered By: Dr. FPamala Hurry Delivery Type: spontaneous vaginal delivery Anesthesia: epidural Placenta: sponatneous Laceration: none Episiotomy: none  Newborn Data: Live born female  Birth Weight: 7 lb 1.2 oz (3209 g) APGAR: 8, 8  Newborn Delivery   Birth date/time:  11/17/2018 14:32:00 Delivery type:  Vaginal, Spontaneous        Hospital/Postpartum Course  (Vaginal Delivery): Pt. Admitted at term for elective IOL and history of rapid labors. See notes and delivery summary for further details. Patient had an uncomplicated postpartum  course.  By time of discharge on PPD#1, her pain was controlled on oral pain medications; she had appropriate lochia and was ambulating, voiding without difficulty and tolerating regular diet.  She was deemed stable for discharge to home.     Labs: CBC Latest Ref Rng & Units 11/17/2018 11/08/2018 10/08/2018  WBC 4.0 - 10.5 K/uL 8.7 6.9 7.0  Hemoglobin 12.0 - 15.0 g/dL 11.9(L) 12.1 11.4(L)  Hematocrit 36.0 - 46.0 % 36.1 36.4 34.6(L)  Platelets 150 - 400 K/uL [id number]  Conflict (See Lab Report): A POS/A POS [facility] [address] [place] [address]  Physical exam:  BP 125/67 (BP Location: Right Arm)   Pulse (!) 47   Temp 97.6 F (36.4 C) (Oral)   Resp 18   Ht _0  (1.626 m)   Wt 90.4 kg   LMP 10/24/2017 (Exact Date)   SpO2 98%   Breastfeeding Unknown   BMI 34.19 kg/m  General: alert and no distress Pulm: normal respiratory effort Lochia: appropriate Abdomen: soft, NT Uterine Fundus: firm, below umbilicus Perineum: healing well, no significant erythema, mild edema Extremities: No evidence of DVT seen on physical exam. trace lower extremity edema.   Disposition: stable, discharge to home Baby Feeding: formula Baby Disposition: home with mom  Contraception: thinking about Paragard IUD  Rh Immune globulin given: N/A Rubella vaccine given: N/A Tdap vaccine given in AP or PP setting: UTD Flu vaccine given in AP or PP setting: UTD   Plan:  KLucresha Dismukewas discharged to home in good condition. [facility]  Discharge Instructions: Per After Visit Summary.  Refer to After Visit Summary and [facility] discharge booklet  Activity: Advance as tolerated. Pelvic rest for 6 weeks.   Diet: Regular, Heart Healthy Discharge Medications: Allergies as of 11/18/2018   No Known Allergies     Medication List    STOP taking these medications   ondansetron 4 MG disintegrating tablet Commonly known  as:  ZOFRAN ODT   ONE TOUCH ULTRA 2 w/Device Kit   ONE TOUCH ULTRA TEST test strip Generic drug:  glucose blood   ONETOUCH DELICA PLUS [id number] Misc     TAKE these medications   ibuprofen 600 MG tablet Commonly known as:  ADVIL,MOTRIN Take 1 tablet (600 mg total) by mouth every 6 (six) hours.   VITAMIN D PO Take 1 tablet by mouth daily.      Outpatient follow up:  Follow-up Information    Aloha Gell, MD. Schedule an appointment as soon as possible for a visit in 6 week(s).   Specialty:  Obstetrics and Gynecology Why:  Postpartum visit Contact information: [place] [address] [phone]           Signed:  Lars Pinks, MSN, CNM [facility]

## 2018-11-18 NOTE — Progress Notes (Signed)
PPD #1 SVD, intact perineum, baby girl "Magda Paganini"  S:  Reports feeling the best she has felt after a delivery. Desires early discharge home today              Tolerating po/ No nausea or vomiting / Denies dizziness or SOB             Bleeding is light             Pain controlled with Motrin and Tylenol              Up ad lib / ambulatory / voiding QS  Newborn formula feeding   O:               VS: BP 125/67 (BP Location: Right Arm)   Pulse (!) 47   Temp 97.6 F (36.4 C) (Oral)   Resp 18   Ht 5\' 4"  (1.626 m)   Wt 90.4 kg   LMP 10/24/2017 (Exact Date)   SpO2 98%   Breastfeeding Unknown   BMI 34.19 kg/m    LABS:              Recent Labs    11/17/18 0752  WBC 8.7  HGB 11.9*  PLT 247               Blood type: --/--/A POS, A POS Performed at [facility], [address] [place], [place] [address]  [phone])  Rubella: Immune (06/05 0000)                     I&O: Intake/Output      12/31 0701 - 01/01 0700 01/01 0701 - 01/02 0700   Urine (mL/kg/hr) 500 (0.2)    Blood 263    Total Output 763    Net -763                       Physical Exam:             Alert and oriented X3  Lungs: Clear and unlabored  Heart: regular rate and rhythm / no murmurs  Abdomen: soft, non-tender, non-distended              Fundus: firm, non-tender, U-1  Perineum: intact, mild edema, no ecchymosis or erythema  Lochia: small, no clots   Extremities: trace pedal edema, no calf pain or tenderness    A: PPD # 1, SVD  Hx. Of Gastric sleeve  Chronic IDA - s/p iron infusions during pregnancy; hgb stable   Doing well - stable status  P: Routine post partum orders  Discharge home today  WOB discharge book, instructions and warning s/s reviewed  Breast care for formula feeding moms discussed   F/u with Dr. Ernestina Penna in 6 weeks - possibly ParaGard IUD for birth control   Carlean Jews, MSN, CNM [facility]

## 2018-11-18 NOTE — Anesthesia Postprocedure Evaluation (Signed)
Anesthesia Post Note  Patient: Katelyn Cox  Procedure(s) Performed: AN AD HOC LABOR EPIDURAL     Patient location during evaluation: Mother Baby Anesthesia Type: Epidural Level of consciousness: awake and alert and oriented Pain management: satisfactory to patient Vital Signs Assessment: post-procedure vital signs reviewed and stable Respiratory status: respiratory function stable Cardiovascular status: stable Postop Assessment: no headache, no backache, epidural receding, patient able to bend at knees, no signs of nausea or vomiting and adequate PO intake Anesthetic complications: no    Last Vitals:  Vitals:   11/18/18 0121 11/18/18 0555  BP: 109/61 125/67  Pulse: (!) 50 (!) 47  Resp: 18 18  Temp: 36.5 C 36.4 C  SpO2:  98%    Last Pain:  Vitals:   11/18/18 0555  TempSrc: Oral  PainSc: 0-No pain   Pain Goal:                 Gianah Batt

## 2018-12-30 DIAGNOSIS — J029 Acute pharyngitis, unspecified: Secondary | ICD-10-CM | POA: Diagnosis not present

## 2018-12-30 DIAGNOSIS — B9789 Other viral agents as the cause of diseases classified elsewhere: Secondary | ICD-10-CM | POA: Diagnosis not present

## 2018-12-30 DIAGNOSIS — J028 Acute pharyngitis due to other specified organisms: Secondary | ICD-10-CM | POA: Diagnosis not present

## 2019-01-12 DIAGNOSIS — Z30430 Encounter for insertion of intrauterine contraceptive device: Secondary | ICD-10-CM | POA: Diagnosis not present

## 2019-04-08 DIAGNOSIS — N760 Acute vaginitis: Secondary | ICD-10-CM | POA: Diagnosis not present

## 2019-04-08 DIAGNOSIS — N83201 Unspecified ovarian cyst, right side: Secondary | ICD-10-CM | POA: Diagnosis not present

## 2020-11-01 DIAGNOSIS — R634 Abnormal weight loss: Secondary | ICD-10-CM | POA: Insufficient documentation

## 2020-11-01 DIAGNOSIS — E65 Localized adiposity: Secondary | ICD-10-CM | POA: Insufficient documentation

## 2020-12-28 ENCOUNTER — Encounter: Payer: 59 | Admitting: Obstetrics and Gynecology

## 2021-11-27 ENCOUNTER — Other Ambulatory Visit: Payer: Self-pay

## 2021-11-27 ENCOUNTER — Ambulatory Visit: Payer: 59 | Admitting: Physician Assistant

## 2021-11-27 ENCOUNTER — Encounter: Payer: Self-pay | Admitting: Physician Assistant

## 2021-11-27 ENCOUNTER — Ambulatory Visit: Payer: Self-pay

## 2021-11-27 VITALS — BP 114/79 | HR 62 | Temp 98.4°F | Resp 12 | Ht 64.0 in | Wt 175.0 lb

## 2021-11-27 VITALS — BP 114/78 | HR 62 | Resp 12 | Ht 64.0 in | Wt 175.0 lb

## 2021-11-27 DIAGNOSIS — H4710 Unspecified papilledema: Secondary | ICD-10-CM | POA: Insufficient documentation

## 2021-11-27 DIAGNOSIS — L500 Allergic urticaria: Secondary | ICD-10-CM | POA: Insufficient documentation

## 2021-11-27 DIAGNOSIS — F064 Anxiety disorder due to known physiological condition: Secondary | ICD-10-CM | POA: Insufficient documentation

## 2021-11-27 DIAGNOSIS — Z021 Encounter for pre-employment examination: Secondary | ICD-10-CM

## 2021-11-27 DIAGNOSIS — J329 Chronic sinusitis, unspecified: Secondary | ICD-10-CM | POA: Insufficient documentation

## 2021-11-27 DIAGNOSIS — H04123 Dry eye syndrome of bilateral lacrimal glands: Secondary | ICD-10-CM | POA: Insufficient documentation

## 2021-11-27 DIAGNOSIS — J309 Allergic rhinitis, unspecified: Secondary | ICD-10-CM | POA: Insufficient documentation

## 2021-11-27 DIAGNOSIS — T7491XA Unspecified adult maltreatment, confirmed, initial encounter: Secondary | ICD-10-CM | POA: Insufficient documentation

## 2021-11-27 DIAGNOSIS — Z309 Encounter for contraceptive management, unspecified: Secondary | ICD-10-CM | POA: Insufficient documentation

## 2021-11-27 DIAGNOSIS — R8290 Unspecified abnormal findings in urine: Secondary | ICD-10-CM

## 2021-11-27 LAB — POCT URINALYSIS DIPSTICK
Bilirubin, UA: NEGATIVE
Blood, UA: NEGATIVE
Glucose, UA: NEGATIVE
Ketones, UA: NEGATIVE
Nitrite, UA: NEGATIVE
Protein, UA: NEGATIVE
Spec Grav, UA: 1.02 (ref 1.010–1.025)
Urobilinogen, UA: 1 E.U./dL
pH, UA: 6 (ref 5.0–8.0)

## 2021-11-27 NOTE — Progress Notes (Signed)
° °  Subjective:BLET physical    Patient ID: Katelyn Cox, female    DOB: 1991/03/13, 31 y.o.   MRN: [id number]  HPI Patient presents for employment physical voices no concerns or complaints.   Review of Systems Negative review of systems.    Objective:   Physical Exam  No acute distress.  Temperature 98.4, pulse 62, respiration 12, BP is 114/78, and patient weighs 175 pounds. No acute distress.  HEENT is unremarkable.  Neck is supple follow-up lymphadenopathy or bruits.  Lungs clear to auscultation.  Heart regular rate and rhythm. Abdomen with negative HSM, normoactive bowel sounds, soft, nontender to palpation. No obvious deformity to the upper or lower extremities.  Patient has full and equal range of motion upper and lower extremities. No obvious cervical or lumbar spine deformity.  Patient has full and equal range of motion cervical lumbar spine. Cranial nerves II through XII are grossly intact.  DTR 2+ clonus.      Assessment & Plan:   Patient cleared for employment pending lab results.

## 2021-11-27 NOTE — Progress Notes (Signed)
Pt denies any concerns or issues.

## 2021-11-27 NOTE — Progress Notes (Signed)
Pt presents today for pre-employment UDS, labs and physical.

## 2021-11-29 LAB — URINE CULTURE

## 2021-11-30 LAB — QUANTIFERON-TB GOLD PLUS
QuantiFERON Mitogen Value: >10 IU/mL
QuantiFERON Nil Value: 0.03 IU/mL
QuantiFERON TB1 Ag Value: 0.05 IU/mL
QuantiFERON TB2 Ag Value: 0.03 IU/mL
QuantiFERON-TB Gold Plus: NEGATIVE

## 2021-11-30 LAB — HEPATITIS B SURFACE ANTIBODY,QUALITATIVE: Hep B Surface Ab, Qual: REACTIVE

## 2021-11-30 LAB — CMP12+LP+TP+TSH+6AC+CBC/D/PLT
ALT: 14 IU/L (ref 0–32)
AST: 19 IU/L (ref 0–40)
Albumin/Globulin Ratio: 1.5 (ref 1.2–2.2)
Albumin: 4.3 g/dL (ref 3.8–4.8)
Alkaline Phosphatase: 50 IU/L (ref 44–121)
BUN/Creatinine Ratio: 15 (ref 9–23)
BUN: 14 mg/dL (ref 6–20)
Basophils Absolute: 0.1 10*3/uL (ref 0.0–0.2)
Basos: 1 %
Bilirubin Total: 1.1 mg/dL (ref 0.0–1.2)
Calcium: 9.5 mg/dL (ref 8.7–10.2)
Chloride: 102 mmol/L (ref 96–106)
Chol/HDL Ratio: 2.2 ratio (ref 0.0–4.4)
Cholesterol, Total: 163 mg/dL (ref 100–199)
Creatinine, Ser: 0.94 mg/dL (ref 0.57–1.00)
EOS (ABSOLUTE): 0.1 10*3/uL (ref 0.0–0.4)
Eos: 2 %
Estimated CHD Risk: <0.5 times avg. (ref 0.0–1.0)
Free Thyroxine Index: 1.7 (ref 1.2–4.9)
GGT: 11 IU/L (ref 0–60)
Globulin, Total: 2.8 g/dL (ref 1.5–4.5)
Glucose: 84 mg/dL (ref 70–99)
HDL: 75 mg/dL (ref >39)
Hematocrit: 42.6 % (ref 34.0–46.6)
Hemoglobin: 14.3 g/dL (ref 11.1–15.9)
Immature Grans (Abs): 0 10*3/uL (ref 0.0–0.1)
Immature Granulocytes: 1 %
Iron: 155 ug/dL (ref 27–159)
LDH: 152 IU/L (ref 119–226)
LDL Chol Calc (NIH): 71 mg/dL (ref 0–99)
Lymphocytes Absolute: 2 10*3/uL (ref 0.7–3.1)
Lymphs: 38 %
MCH: 31.2 pg (ref 26.6–33.0)
MCHC: 33.6 g/dL (ref 31.5–35.7)
MCV: 93 fL (ref 79–97)
Monocytes Absolute: 0.5 10*3/uL (ref 0.1–0.9)
Monocytes: 9 %
Neutrophils Absolute: 2.6 10*3/uL (ref 1.4–7.0)
Neutrophils: 49 %
Phosphorus: 3.4 mg/dL (ref 3.0–4.3)
Platelets: 277 10*3/uL (ref 150–450)
Potassium: 4.4 mmol/L (ref 3.5–5.2)
RBC: 4.59 x10E6/uL (ref 3.77–5.28)
RDW: 12.8 % (ref 11.7–15.4)
Sodium: 139 mmol/L (ref 134–144)
T3 Uptake Ratio: 27 % (ref 24–39)
T4, Total: 6.3 ug/dL (ref 4.5–12.0)
TSH: 1.35 u[IU]/mL (ref 0.450–4.500)
Total Protein: 7.1 g/dL (ref 6.0–8.5)
Triglycerides: 92 mg/dL (ref 0–149)
Uric Acid: 6.5 mg/dL — ABNORMAL HIGH (ref 2.6–6.2)
VLDL Cholesterol Cal: 17 mg/dL (ref 5–40)
WBC: 5.2 10*3/uL (ref 3.4–10.8)
eGFR: 83 mL/min/{1.73_m2} (ref >59)

## 2022-02-13 ENCOUNTER — Other Ambulatory Visit: Payer: Self-pay

## 2022-02-13 DIAGNOSIS — R350 Frequency of micturition: Secondary | ICD-10-CM

## 2022-02-13 LAB — POCT URINALYSIS DIPSTICK
Bilirubin, UA: NEGATIVE
Blood, UA: NEGATIVE
Glucose, UA: NEGATIVE
Ketones, UA: NEGATIVE
Leukocytes, UA: NEGATIVE
Nitrite, UA: NEGATIVE
Protein, UA: NEGATIVE
Spec Grav, UA: 1.015 (ref 1.010–1.025)
Urobilinogen, UA: 0.2 E.U./dL
pH, UA: 6 (ref 5.0–8.0)

## 2022-02-13 NOTE — Progress Notes (Addendum)
Presents to clinic C/O urinary frequency. ? ?Called Kailynne to review S/Sx & Hx: ?Urinary frequency started 3 days ago ?Low back pain 2 days ago, but not at present. ?Some pelvic pressure ? ?Denies:  fever, N/V, Cramping, Hematuria, Vaginal D/C or Itching ? ?States has Hx of UTI's ? ?POCT Urinalysis in clinic negative or blood, leukocytes & nitrites ? ?Will send specimen to [facility] for culture ? ?Reviewed above with Randel Pigg, PA-C - will wait for urine culture results ? ?AMD ?

## 2022-02-16 LAB — URINE CULTURE

## 2022-03-05 ENCOUNTER — Ambulatory Visit
Admission: RE | Admit: 2022-03-05 | Discharge: 2022-03-05 | Disposition: A | Discharge status: Home / Self Care | Payer: Self-pay | Source: Ambulatory Visit | Attending: Physician Assistant | Admitting: Physician Assistant

## 2022-03-05 ENCOUNTER — Ambulatory Visit: Payer: Self-pay | Admitting: Physician Assistant

## 2022-03-05 ENCOUNTER — Ambulatory Visit
Admission: RE | Admit: 2022-03-05 | Discharge: 2022-03-05 | Disposition: A | Discharge status: Home / Self Care | Payer: Self-pay | Attending: Physician Assistant | Admitting: Physician Assistant

## 2022-03-05 ENCOUNTER — Encounter: Payer: Self-pay | Admitting: Physician Assistant

## 2022-03-05 VITALS — BP 117/80 | HR 76 | Temp 97.8°F | Resp 14 | Ht 65.0 in | Wt 170.0 lb

## 2022-03-05 DIAGNOSIS — M5416 Radiculopathy, lumbar region: Secondary | ICD-10-CM

## 2022-03-05 NOTE — Progress Notes (Signed)
Sciatica nerve pinch on left side. Pt states shes had trouble with it before while pregnant. Pt does work out and doesn't know if that flared it up or her vest is pushing on the nerve. Same side started hurting last night while laying on stomach. Pt has burning/stabbing sensation in left buttocks.  ?

## 2022-03-05 NOTE — Progress Notes (Signed)
? ?  Subjective: Back pain  ? ? Patient ID: Katelyn Cox, female    DOB: 11/11/1991, 31 y.o.   MRN: [id number] ? ?HPI ?Patient complain of radicular back pain to the left lower extremity.  Patient works as a Engineer, structural but denies any particular provocative event.  Patient denies bladder or bowel dysfunction.  Rates the pain as a 5/10.  No relief with anti-inflammatory and topical applications.  Patient stated history of sciatica with pregnancy.  There is a concern that the patient equipment may be aggravating her complaint.  History of obesity which was controlled with bariatric surgery. ? ? ?Review of Systems ?Allergic rhinitis and urticaria ?   ?Objective:  ? Physical Exam ?Temperature is 97.8, respiration 14, pulse 76, BP is 117/80.  Patient weighs 170 pounds and BMI is 28.29. ?No obvious deformity to the lumbar spine.  Patient has full and equal range of motion.  He has moderate guarding with palpation of L3 through L5.  Patient has negative straight leg test in the supine position.  Lower extremity strength is 5/5.  Reflexes 2+ without clonus. ? ? ? ?   ?Assessment & Plan: Radicular back pain.  ?Patient had a Lidoderm patch applied prior to departure.  She will report for images and follow-up in 24 hours. ? ?

## 2022-03-07 ENCOUNTER — Ambulatory Visit (INDEPENDENT_AMBULATORY_CARE_PROVIDER_SITE_OTHER): Payer: 59 | Admitting: Obstetrics and Gynecology

## 2022-03-07 ENCOUNTER — Encounter: Payer: Self-pay | Admitting: Obstetrics and Gynecology

## 2022-03-07 VITALS — BP 109/72 | HR 70 | Ht 65.0 in | Wt 188.8 lb

## 2022-03-07 DIAGNOSIS — Z7689 Persons encountering health services in other specified circumstances: Secondary | ICD-10-CM | POA: Diagnosis not present

## 2022-03-07 DIAGNOSIS — Z975 Presence of (intrauterine) contraceptive device: Secondary | ICD-10-CM

## 2022-03-07 DIAGNOSIS — N921 Excessive and frequent menstruation with irregular cycle: Secondary | ICD-10-CM

## 2022-03-07 NOTE — Progress Notes (Signed)
Patient presents today for IUD, Rutha Bouchard, issues. She states irregular bleeding on and in between cycles, bloating and pain after intercourse and weight gain. Patient has not had strings checked in approximately 5 years. Patient states no other questions or concerns.  ?
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
## 2022-03-14 ENCOUNTER — Other Ambulatory Visit: Payer: 59

## 2022-03-26 ENCOUNTER — Other Ambulatory Visit: Payer: Self-pay

## 2022-03-26 DIAGNOSIS — R110 Nausea: Secondary | ICD-10-CM
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
## 2022-03-27 LAB — NOVEL CORONAVIRUS, NAA: SARS-CoV-2, NAA: NOT DETECTED

## 2022-06-17 ENCOUNTER — Telehealth: Payer: Self-pay

## 2022-06-17 DIAGNOSIS — S46911A Strain of unspecified muscle, fascia and tendon at shoulder and upper arm level, right arm, initial encounter: Secondary | ICD-10-CM | POA: Diagnosis not present

## 2022-06-24 ENCOUNTER — Encounter: Payer: Self-pay | Admitting: Physician Assistant

## 2022-06-24 ENCOUNTER — Ambulatory Visit: Payer: Self-pay | Admitting: Physician Assistant

## 2022-06-24 DIAGNOSIS — M79601 Pain in right arm: Secondary | ICD-10-CM

## 2022-06-24 NOTE — Progress Notes (Signed)
   Subjective: Right elbow pain    Patient ID: Katelyn Cox, female    DOB: 1991/06/27, 31 y.o.   MRN: [id number]  HPI Patient is here for follow-up of right arm pain which occurred on June 14, 2022 secondary to her weight training instead.  Patient has been followed by [facility] and is return back to a trial of regular duty today.   Review of Systems Negative except for chief complaint    Objective:   Physical Exam See nurses note for vital signs. Patient is right-hand dominant.  No obvious deformity, edema, erythema or ecchymosis to the right upper extremity.  Patient has full and equal range of motion.  Patient is neurovascular intact.       Assessment & Plan: Right arm pain  Patient is released back to a trial of regular duty.  Patient will follow-up [facility] if condition worsens.

## 2022-06-24 NOTE — Progress Notes (Signed)
Pt needs a return to work note for injury outside of work. Pt stating shes doing better.

## 2022-07-04 ENCOUNTER — Encounter: Payer: Self-pay | Admitting: Emergency Medicine

## 2022-07-04 ENCOUNTER — Emergency Department
Admission: EM | Admit: 2022-07-04 | Discharge: 2022-07-04 | Disposition: A | Discharge status: Home / Self Care | Payer: Self-pay | Attending: Emergency Medicine | Admitting: Emergency Medicine

## 2022-07-04 DIAGNOSIS — Z139 Encounter for screening, unspecified: Secondary | ICD-10-CM

## 2022-07-04 DIAGNOSIS — Z0000 Encounter for general adult medical examination without abnormal findings: Secondary | ICD-10-CM | POA: Insufficient documentation
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
## 2022-07-04 NOTE — ED Notes (Signed)
Pt presented with supervisor from [employer]. Oral swab preformed while wearing gloves the entire time in order not to contaminate the oral swab. Paperwork completed. Pt given her copy, supervisor given his copy, and copy number 2 was faxed. Sample was walked to the lab, documented on the clip board, and placed in the appropriate place by this tech.
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
## 2022-07-08 NOTE — Telephone Encounter (Signed)
JB

## 2022-07-26 DIAGNOSIS — N938 Other specified abnormal uterine and vaginal bleeding: Secondary | ICD-10-CM | POA: Diagnosis not present

## 2022-07-26 DIAGNOSIS — N939 Abnormal uterine and vaginal bleeding, unspecified: Secondary | ICD-10-CM | POA: Diagnosis not present

## 2022-07-26 DIAGNOSIS — R102 Pelvic and perineal pain: Secondary | ICD-10-CM | POA: Diagnosis not present

## 2022-07-26 DIAGNOSIS — Z01411 Encounter for gynecological examination (general) (routine) with abnormal findings: Secondary | ICD-10-CM | POA: Diagnosis not present

## 2022-08-12 DIAGNOSIS — R102 Pelvic and perineal pain: Secondary | ICD-10-CM | POA: Diagnosis not present

## 2022-08-12 DIAGNOSIS — N938 Other specified abnormal uterine and vaginal bleeding: Secondary | ICD-10-CM | POA: Diagnosis not present

## 2022-08-20 DIAGNOSIS — J208 Acute bronchitis due to other specified organisms: Secondary | ICD-10-CM | POA: Diagnosis not present

## 2022-11-04 DIAGNOSIS — S52502A Unspecified fracture of the lower end of left radius, initial encounter for closed fracture: Secondary | ICD-10-CM | POA: Diagnosis not present

## 2022-11-19 DIAGNOSIS — S52502A Unspecified fracture of the lower end of left radius, initial encounter for closed fracture: Secondary | ICD-10-CM | POA: Diagnosis not present

## 2022-12-09 IMAGING — CR DG LUMBAR SPINE COMPLETE 4+V
1 series · 5 of 5 positions shown · non-contrast
Comparison: Plain films lumbar spine 02/07/2021.

CLINICAL DATA: Mid and low back pain radiating into the posterior
aspect of the left hip since 03/04/2022.

EXAM:
LUMBAR SPINE - COMPLETE 4+ VIEW

[Series 1: dg lumbar spine complete 4 +v · 0.14mm/px · 5 of 5 slices shown]
[im 1/5]
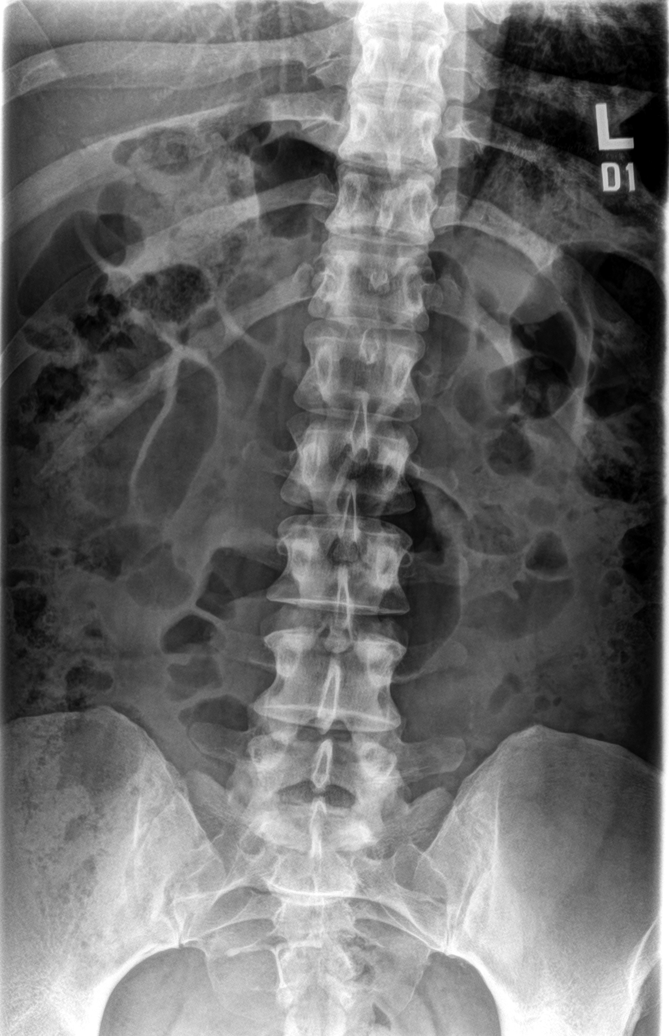
[im 2/5]
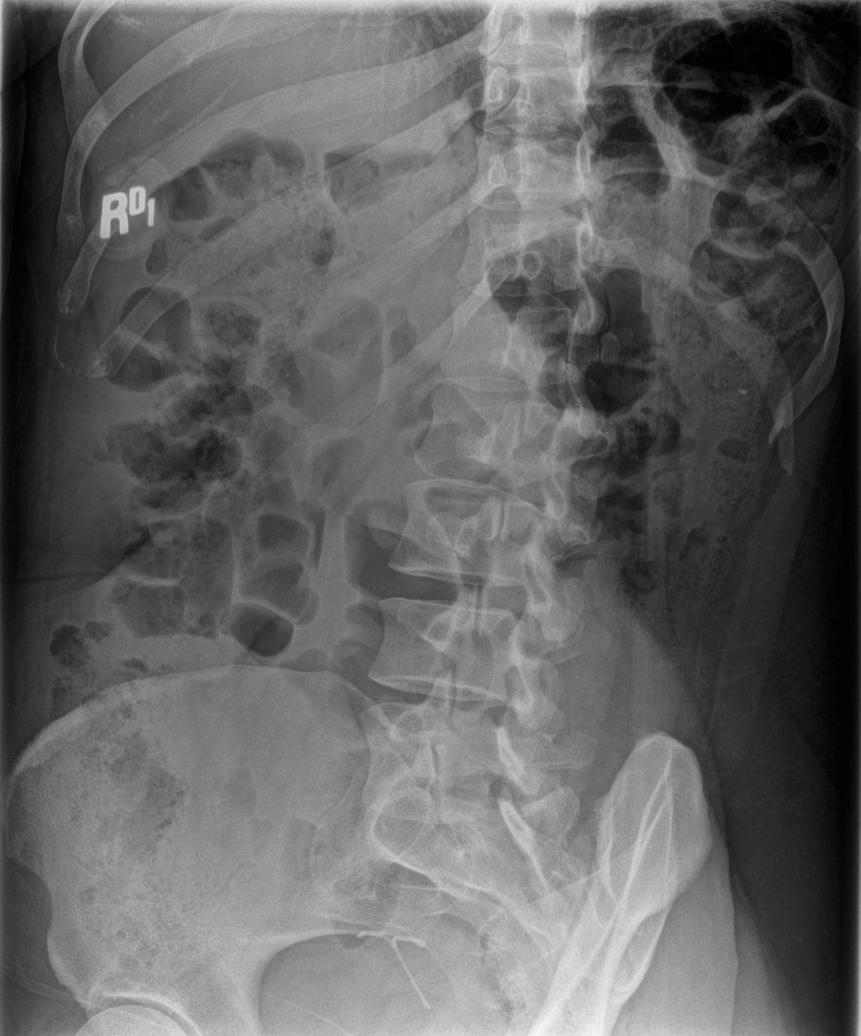
[im 3/5]
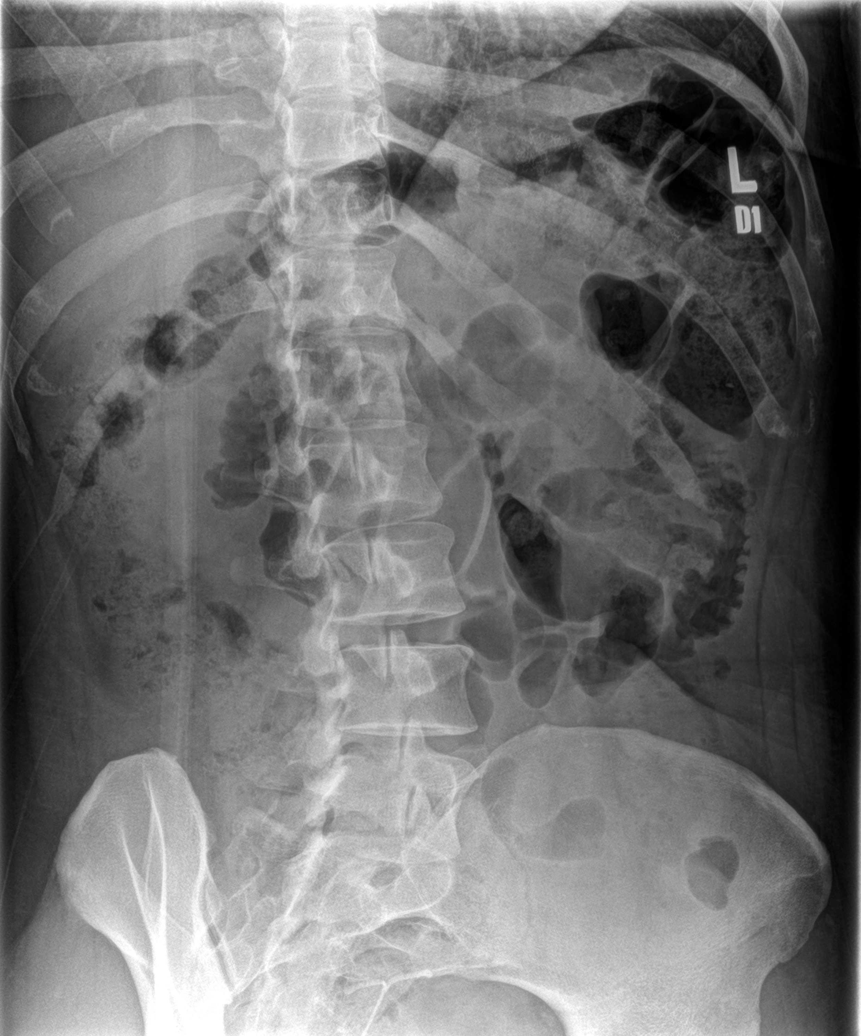
[im 4/5]
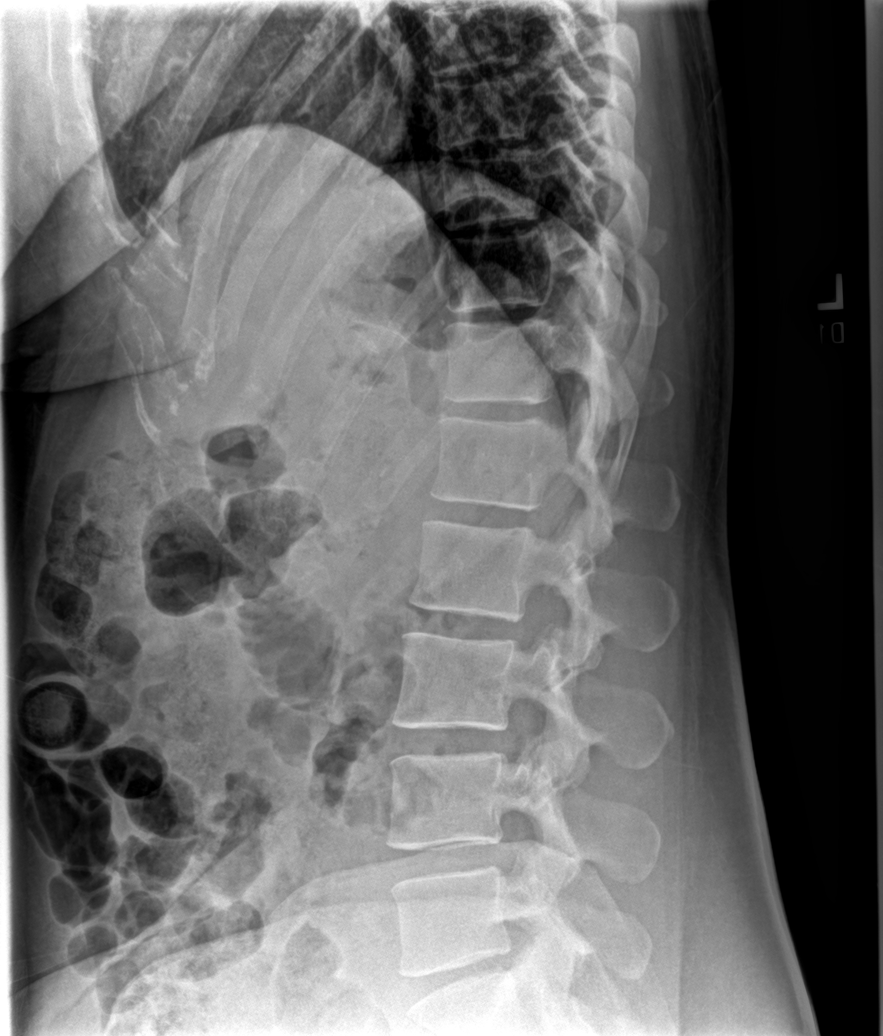
[im 5/5]
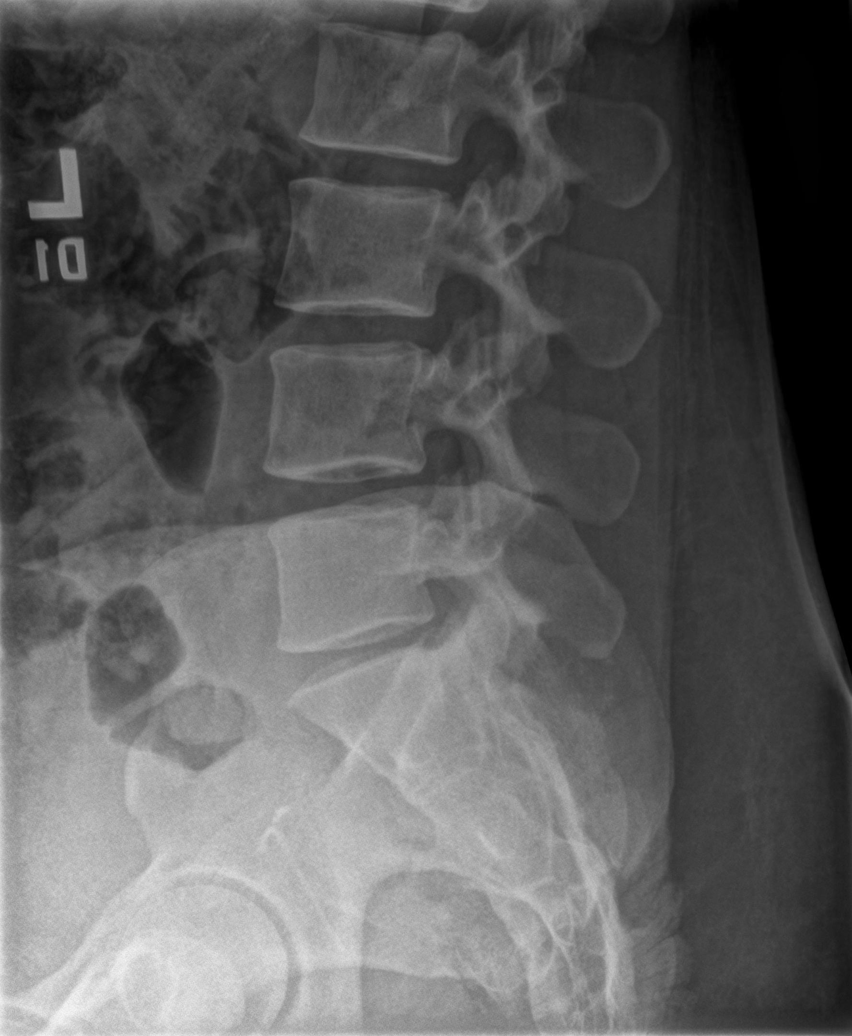

[5 of 5 positions shown; findings below may reference images not displayed]

FINDINGS: There is no evidence of lumbar spine fracture. Alignment is normal.
Intervertebral disc spaces are maintained.
IMPRESSION: Negative exam.

## 2022-12-19 ENCOUNTER — Encounter: Payer: Self-pay | Admitting: Physician Assistant

## 2022-12-19 ENCOUNTER — Ambulatory Visit: Payer: Self-pay | Admitting: Physician Assistant

## 2022-12-19 VITALS — BP 115/76 | HR 75 | Resp 12 | Ht 64.0 in | Wt 190.0 lb

## 2022-12-19 DIAGNOSIS — S63502A Unspecified sprain of left wrist, initial encounter: Secondary | ICD-10-CM

## 2022-12-19 NOTE — Progress Notes (Signed)
Pt presents today for [facility] 10-25-22  RTW note for full duty per ortho. RIGHT WRIST, FRACTURE.
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
## 2023-02-01 DIAGNOSIS — N390 Urinary tract infection, site not specified: Secondary | ICD-10-CM | POA: Diagnosis not present

## 2023-02-01 DIAGNOSIS — R350 Frequency of micturition: Secondary | ICD-10-CM | POA: Diagnosis not present

## 2023-02-02 ENCOUNTER — Ambulatory Visit (INDEPENDENT_AMBULATORY_CARE_PROVIDER_SITE_OTHER): Payer: 59

## 2023-02-02 ENCOUNTER — Ambulatory Visit
Admission: RE | Admit: 2023-02-02 | Discharge: 2023-02-02 | Disposition: A | Discharge status: Home / Self Care | Payer: 59 | Source: Ambulatory Visit | Attending: Family Medicine | Admitting: Family Medicine

## 2023-02-02 VITALS — BP 120/76 | HR 60 | Temp 98.2°F | Resp 16 | Ht 64.0 in | Wt 200.0 lb

## 2023-02-02 DIAGNOSIS — S0033XA Contusion of nose, initial encounter: Secondary | ICD-10-CM

## 2023-02-02 DIAGNOSIS — T1490XA Injury, unspecified, initial encounter: Secondary | ICD-10-CM | POA: Diagnosis not present

## 2023-02-02 NOTE — ED Triage Notes (Signed)
Patient states that she hit her face on a wall at home this morning.  She has since noticed the right side of nose starting to swell.  Wants to make sure it's not broken.  Has taken Ibuprofen 800mg  and still complaining of headache.  No loss of consciousness.

## 2023-02-02 NOTE — Discharge Instructions (Signed)
Use ice to reduce pain and swelling OTC medication for pain
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
## 2023-02-03 ENCOUNTER — Telehealth: Payer: Self-pay | Admitting: Emergency Medicine

## 2023-02-03 NOTE — Telephone Encounter (Signed)
LMTRC.  Advised if doing well to disregard the call.  Any questions or concerns, feel free to contact the office. 

## 2023-03-23 ENCOUNTER — Ambulatory Visit: Payer: 59

## 2023-04-07 DIAGNOSIS — Z0000 Encounter for general adult medical examination without abnormal findings: Secondary | ICD-10-CM | POA: Diagnosis not present

## 2023-04-07 DIAGNOSIS — F419 Anxiety disorder, unspecified: Secondary | ICD-10-CM | POA: Diagnosis not present

## 2023-04-07 DIAGNOSIS — G4700 Insomnia, unspecified: Secondary | ICD-10-CM | POA: Diagnosis not present

## 2023-04-07 DIAGNOSIS — Z1331 Encounter for screening for depression: Secondary | ICD-10-CM | POA: Diagnosis not present

## 2023-04-07 DIAGNOSIS — Z79899 Other long term (current) drug therapy: Secondary | ICD-10-CM | POA: Diagnosis not present

## 2023-04-28 ENCOUNTER — Encounter: Payer: Self-pay | Admitting: Physician Assistant

## 2023-05-05 DIAGNOSIS — M9903 Segmental and somatic dysfunction of lumbar region: Secondary | ICD-10-CM | POA: Diagnosis not present

## 2023-05-05 DIAGNOSIS — M5137 Other intervertebral disc degeneration, lumbosacral region: Secondary | ICD-10-CM | POA: Diagnosis not present

## 2023-05-05 DIAGNOSIS — M545 Low back pain, unspecified: Secondary | ICD-10-CM | POA: Diagnosis not present

## 2023-05-05 DIAGNOSIS — M9905 Segmental and somatic dysfunction of pelvic region: Secondary | ICD-10-CM | POA: Diagnosis not present

## 2023-05-05 DIAGNOSIS — M25551 Pain in right hip: Secondary | ICD-10-CM | POA: Diagnosis not present

## 2023-05-06 DIAGNOSIS — M5137 Other intervertebral disc degeneration, lumbosacral region: Secondary | ICD-10-CM | POA: Diagnosis not present

## 2023-05-06 DIAGNOSIS — M9905 Segmental and somatic dysfunction of pelvic region: Secondary | ICD-10-CM | POA: Diagnosis not present

## 2023-05-06 DIAGNOSIS — M25551 Pain in right hip: Secondary | ICD-10-CM | POA: Diagnosis not present

## 2023-05-06 DIAGNOSIS — M9903 Segmental and somatic dysfunction of lumbar region: Secondary | ICD-10-CM | POA: Diagnosis not present

## 2023-05-08 DIAGNOSIS — M5137 Other intervertebral disc degeneration, lumbosacral region: Secondary | ICD-10-CM | POA: Diagnosis not present

## 2023-05-08 DIAGNOSIS — M9905 Segmental and somatic dysfunction of pelvic region: Secondary | ICD-10-CM | POA: Diagnosis not present

## 2023-05-08 DIAGNOSIS — M25551 Pain in right hip: Secondary | ICD-10-CM | POA: Diagnosis not present

## 2023-05-08 DIAGNOSIS — M9903 Segmental and somatic dysfunction of lumbar region: Secondary | ICD-10-CM | POA: Diagnosis not present

## 2023-05-12 ENCOUNTER — Encounter: Payer: Self-pay | Admitting: Emergency Medicine

## 2023-05-12 ENCOUNTER — Ambulatory Visit: Payer: Self-pay | Admitting: Emergency Medicine

## 2023-05-12 VITALS — BP 108/78 | HR 56 | Temp 97.7°F | Resp 12

## 2023-05-12 DIAGNOSIS — Z7689 Persons encountering health services in other specified circumstances: Secondary | ICD-10-CM

## 2023-05-12 NOTE — Progress Notes (Signed)
Patient ID: Katelyn Cox, female   DOB: Apr 16, 1991, 32 y.o.   MRN: [id number]   Recheck and re-evaluation for return to work.  Patient with sciatica that is 95% improved.  Able to return to full duty.    Papers filled out.

## 2023-05-12 NOTE — Progress Notes (Signed)
Sacralitis  Taking Meloxicam & it's helping Started last Wednesday  Returning to work - states she feels able to return  AMD

## 2023-05-13 DIAGNOSIS — M5137 Other intervertebral disc degeneration, lumbosacral region: Secondary | ICD-10-CM | POA: Diagnosis not present

## 2023-05-13 DIAGNOSIS — M25551 Pain in right hip: Secondary | ICD-10-CM | POA: Diagnosis not present

## 2023-05-13 DIAGNOSIS — M9903 Segmental and somatic dysfunction of lumbar region: Secondary | ICD-10-CM | POA: Diagnosis not present

## 2023-05-13 DIAGNOSIS — M9905 Segmental and somatic dysfunction of pelvic region: Secondary | ICD-10-CM | POA: Diagnosis not present

## 2023-05-26 DIAGNOSIS — M5136 Other intervertebral disc degeneration, lumbar region: Secondary | ICD-10-CM | POA: Diagnosis not present

## 2023-12-21 ENCOUNTER — Ambulatory Visit: Payer: 59

## 2023-12-21 DIAGNOSIS — R0981 Nasal congestion: Secondary | ICD-10-CM | POA: Diagnosis not present

## 2023-12-21 DIAGNOSIS — R519 Headache, unspecified: Secondary | ICD-10-CM | POA: Diagnosis not present

## 2024-02-20 ENCOUNTER — Ambulatory Visit: Payer: Self-pay

## 2024-02-20 DIAGNOSIS — Z0000 Encounter for general adult medical examination without abnormal findings: Secondary | ICD-10-CM

## 2024-02-20 NOTE — Progress Notes (Signed)
 Pt presents today to complete labs for physical. Pt declined to void due to menses. Gretel Acre

## 2024-02-21 LAB — CMP12+LP+TP+TSH+6AC+CBC/D/PLT
ALT: 47 IU/L — ABNORMAL HIGH (ref 0–32)
AST: 38 IU/L (ref 0–40)
Albumin: 4.3 g/dL (ref 3.9–4.9)
Alkaline Phosphatase: 55 IU/L (ref 44–121)
BUN/Creatinine Ratio: 18 (ref 9–23)
BUN: 12 mg/dL (ref 6–20)
Basophils Absolute: 0.1 10*3/uL (ref 0.0–0.2)
Basos: 1 %
Bilirubin Total: 0.9 mg/dL (ref 0.0–1.2)
Calcium: 9 mg/dL (ref 8.7–10.2)
Chloride: 106 mmol/L (ref 96–106)
Chol/HDL Ratio: 2.4 ratio (ref 0.0–4.4)
Cholesterol, Total: 159 mg/dL (ref 100–199)
Creatinine, Ser: 0.65 mg/dL (ref 0.57–1.00)
EOS (ABSOLUTE): 0.1 10*3/uL (ref 0.0–0.4)
Eos: 2 %
Estimated CHD Risk: <0.5 times avg. (ref 0.0–1.0)
Free Thyroxine Index: 1.5 (ref 1.2–4.9)
GGT: 15 IU/L (ref 0–60)
Globulin, Total: 2.2 g/dL (ref 1.5–4.5)
Glucose: 77 mg/dL (ref 70–99)
HDL: 65 mg/dL (ref >39)
Hematocrit: 38.6 % (ref 34.0–46.6)
Hemoglobin: 12.7 g/dL (ref 11.1–15.9)
Immature Grans (Abs): 0 10*3/uL (ref 0.0–0.1)
Immature Granulocytes: 0 %
Iron: 175 ug/dL — ABNORMAL HIGH (ref 27–159)
LDH: 182 IU/L (ref 119–226)
LDL Chol Calc (NIH): 81 mg/dL (ref 0–99)
Lymphocytes Absolute: 2 10*3/uL (ref 0.7–3.1)
Lymphs: 38 %
MCH: 31.4 pg (ref 26.6–33.0)
MCHC: 32.9 g/dL (ref 31.5–35.7)
MCV: 95 fL (ref 79–97)
Monocytes Absolute: 0.5 10*3/uL (ref 0.1–0.9)
Monocytes: 10 %
Neutrophils Absolute: 2.5 10*3/uL (ref 1.4–7.0)
Neutrophils: 49 %
Phosphorus: 2.9 mg/dL — ABNORMAL LOW (ref 3.0–4.3)
Platelets: 274 10*3/uL (ref 150–450)
Potassium: 4.1 mmol/L (ref 3.5–5.2)
RBC: 4.05 x10E6/uL (ref 3.77–5.28)
RDW: 12.5 % (ref 11.7–15.4)
Sodium: 141 mmol/L (ref 134–144)
T3 Uptake Ratio: 30 % (ref 24–39)
T4, Total: 5 ug/dL (ref 4.5–12.0)
TSH: 2.34 u[IU]/mL (ref 0.450–4.500)
Total Protein: 6.5 g/dL (ref 6.0–8.5)
Triglycerides: 67 mg/dL (ref 0–149)
Uric Acid: 5 mg/dL (ref 2.6–6.2)
VLDL Cholesterol Cal: 13 mg/dL (ref 5–40)
WBC: 5.2 10*3/uL (ref 3.4–10.8)
eGFR: 118 mL/min/{1.73_m2} (ref >59)
